# Patient Record
Sex: Female | Born: 1985 | Hispanic: No | Marital: Single | Smoking: Never smoker
Health system: Southern US, Community
[De-identification: ages and names within clinical notes are randomized; demographics above are authoritative.]

---

## 2007-09-15 HISTORY — PX: DILATION AND CURETTAGE OF UTERUS: SHX78

## 2009-09-14 HISTORY — PX: FOOT SURGERY: SHX648

## 2014-12-10 ENCOUNTER — Ambulatory Visit: Payer: Self-pay | Admitting: Physician Assistant

## 2015-01-09 ENCOUNTER — Encounter: Payer: Self-pay | Admitting: *Deleted

## 2015-01-17 ENCOUNTER — Ambulatory Visit
Admission: RE | Admit: 2015-01-17 | Discharge: 2015-01-17 | Disposition: A | Discharge status: Home / Self Care | Payer: 59 | Source: Ambulatory Visit | Attending: Otolaryngology | Admitting: Otolaryngology

## 2015-01-17 ENCOUNTER — Ambulatory Visit: Payer: 59 | Admitting: Student in an Organized Health Care Education/Training Program

## 2015-01-17 ENCOUNTER — Encounter
Admission: RE | Disposition: A | Discharge status: Home / Self Care | Payer: Self-pay | Source: Ambulatory Visit | Attending: Otolaryngology

## 2015-01-17 ENCOUNTER — Encounter: Payer: Self-pay | Admitting: *Deleted

## 2015-01-17 DIAGNOSIS — Z9889 Other specified postprocedural states: Secondary | ICD-10-CM | POA: Insufficient documentation

## 2015-01-17 DIAGNOSIS — Z8261 Family history of arthritis: Secondary | ICD-10-CM | POA: Diagnosis not present

## 2015-01-17 DIAGNOSIS — L720 Epidermal cyst: Secondary | ICD-10-CM | POA: Diagnosis not present

## 2015-01-17 DIAGNOSIS — Z8249 Family history of ischemic heart disease and other diseases of the circulatory system: Secondary | ICD-10-CM | POA: Diagnosis not present

## 2015-01-17 DIAGNOSIS — L723 Sebaceous cyst: Secondary | ICD-10-CM | POA: Diagnosis present

## 2015-01-17 HISTORY — PX: LESION REMOVAL: SHX5196

## 2015-01-17 SURGERY — MINOR EXCISION OF LESION
Anesthesia: General

## 2015-01-17 MED ORDER — GLYCOPYRROLATE 0.2 MG/ML IJ SOLN
INTRAMUSCULAR | Status: DC | PRN
  Administered 2015-01-17: 0.2 mg via INTRAVENOUS

## 2015-01-17 MED ORDER — OXYCODONE HCL 5 MG PO TABS
5.0000 mg | ORAL_TABLET | Freq: Once | ORAL | Status: AC | PRN
  Administered 2015-01-17: 5 mg via ORAL

## 2015-01-17 MED ORDER — PROPOFOL 10 MG/ML IV BOLUS
INTRAVENOUS | Status: DC | PRN
  Administered 2015-01-17: 200 mg via INTRAVENOUS

## 2015-01-17 MED ORDER — OXYCODONE-ACETAMINOPHEN 5-325 MG PO TABS: 1.0000 | ORAL_TABLET | Freq: Once | ORAL | Status: DC | PRN

## 2015-01-17 MED ORDER — LIDOCAINE HCL (PF) 1 % IJ SOLN: INTRAMUSCULAR | Status: DC | PRN

## 2015-01-17 MED ORDER — LIDOCAINE-EPINEPHRINE 1 %-1:100000 IJ SOLN
INTRAMUSCULAR | Status: DC | PRN
  Administered 2015-01-17: 4 mL

## 2015-01-17 MED ORDER — ONDANSETRON HCL 4 MG/2ML IJ SOLN
INTRAMUSCULAR | Status: DC | PRN
  Administered 2015-01-17: 4 mg via INTRAVENOUS

## 2015-01-17 MED ORDER — HYDROCODONE-ACETAMINOPHEN 7.5-325 MG PO TABS: 1.0000 | ORAL_TABLET | Freq: Once | ORAL | Status: DC | PRN

## 2015-01-17 MED ORDER — TRAMADOL HCL 50 MG PO TABS: ORAL_TABLET | ORAL | Status: DC

## 2015-01-17 MED ORDER — MIDAZOLAM HCL 5 MG/5ML IJ SOLN
INTRAMUSCULAR | Status: DC | PRN
  Administered 2015-01-17: 2 mg via INTRAVENOUS

## 2015-01-17 MED ORDER — FENTANYL CITRATE (PF) 100 MCG/2ML IJ SOLN
INTRAMUSCULAR | Status: DC | PRN
  Administered 2015-01-17: 25 ug via INTRAVENOUS
  Administered 2015-01-17: 50 ug via INTRAVENOUS
  Administered 2015-01-17 (×3): 25 ug via INTRAVENOUS

## 2015-01-17 MED ORDER — LIDOCAINE HCL (CARDIAC) 20 MG/ML IV SOLN
INTRAVENOUS | Status: DC | PRN
  Administered 2015-01-17: 50 mg via INTRAVENOUS

## 2015-01-17 MED ORDER — LACTATED RINGERS IV SOLN
INTRAVENOUS | Status: DC
  Administered 2015-01-17: 09:00:00 via INTRAVENOUS

## 2015-01-17 MED ORDER — DEXAMETHASONE SODIUM PHOSPHATE 4 MG/ML IJ SOLN
INTRAMUSCULAR | Status: DC | PRN
  Administered 2015-01-17: 4 mg via INTRAVENOUS

## 2015-01-17 MED ORDER — BACITRACIN 500 UNIT/GM EX OINT
TOPICAL_OINTMENT | CUTANEOUS | Status: DC | PRN
  Administered 2015-01-17: 1 via TOPICAL

## 2015-01-17 SURGICAL SUPPLY — 29 items
BLADE SURG 15 STRL LF DISP TIS (BLADE) IMPLANT
BLADE SURG 15 STRL SS (BLADE)
CORD BIP STRL DISP 12FT (MISCELLANEOUS) IMPLANT
DRAPE HEAD BAR (DRAPES) ×3 IMPLANT
DRESSING TELFA 4X3 1S ST N-ADH (GAUZE/BANDAGES/DRESSINGS) IMPLANT
ELECT CAUTERY BLADE TIP 2.5 (TIP)
ELECT CAUTERY NEEDLE 2.0 MIC (NEEDLE) ×3 IMPLANT
ELECTRODE CAUTERY BLDE TIP 2.5 (TIP) IMPLANT
GAUZE SPONGE 4X4 12PLY STRL (GAUZE/BANDAGES/DRESSINGS) IMPLANT
GLOVE PI ULTRA LF STRL 7.5 (GLOVE) ×2 IMPLANT
GLOVE PI ULTRA NON LATEX 7.5 (GLOVE) ×4
LIQUID BAND (GAUZE/BANDAGES/DRESSINGS) IMPLANT
NEEDLE HYPO 25GX1X1/2 BEV (NEEDLE) ×3 IMPLANT
NS IRRIG 500ML POUR BTL (IV SOLUTION) ×3 IMPLANT
PACK DRAPE NASAL/ENT (PACKS) ×3 IMPLANT
PAD GROUND ADULT SPLIT (MISCELLANEOUS) ×3 IMPLANT
PENCIL ELECTRO HAND CTR (MISCELLANEOUS) ×3 IMPLANT
SOL PREP PVP 2OZ (MISCELLANEOUS) ×3
SOLUTION PREP PVP 2OZ (MISCELLANEOUS) ×1 IMPLANT
SPONGE XRAY 4X4 16PLY STRL (MISCELLANEOUS) ×6 IMPLANT
STRAP BODY AND KNEE 60X3 (MISCELLANEOUS) ×3 IMPLANT
SUCTION FRAZIER TIP 10 FR DISP (SUCTIONS) IMPLANT
SUT PROLENE 5 0 PS 3 (SUTURE) ×6 IMPLANT
SUT VIC AB 4-0 RB1 27 (SUTURE) ×2
SUT VIC AB 4-0 RB1 27X BRD (SUTURE) ×1 IMPLANT
SUT VIC AB 5-0 P-3 18X BRD (SUTURE) ×1 IMPLANT
SUT VIC AB 5-0 P3 18 (SUTURE) ×2
SYRINGE 10CC LL (SYRINGE) ×3 IMPLANT
TOWEL OR 17X26 4PK STRL BLUE (TOWEL DISPOSABLE) ×3 IMPLANT

## 2015-01-17 NOTE — Anesthesia Postprocedure Evaluation (Signed)
  Anesthesia Post-op Note  Patient: Janice BallShavon Alease Laba  Procedure(s) Performed: Procedure(s): MINOR EXICISION OF LESION (N/A)  Anesthesia type:General  Patient location: PACU  Post pain: Pain level controlled  Post assessment: Post-op Vital signs reviewed, Patient's Cardiovascular Status Stable, Respiratory Function Stable, Patent Airway and No signs of Nausea or vomiting  Post vital signs: Reviewed and stable  Last Vitals:  Filed Vitals:   01/17/15 1219  BP: 108/77  Pulse: 83  Temp:   Resp: 18    Level of consciousness: awake, alert  and patient cooperative  Complications: No apparent anesthesia complications

## 2015-01-17 NOTE — Anesthesia Procedure Notes (Signed)
Procedure Name: LMA Insertion Date/Time: 01/17/2015 9:10 AM Performed by: Andee PolesBUSH, Haifa Hatton Pre-anesthesia Checklist: Patient identified, Emergency Drugs available, Suction available, Timeout performed and Patient being monitored Patient Re-evaluated:Patient Re-evaluated prior to inductionOxygen Delivery Method: Circle system utilized Preoxygenation: Pre-oxygenation with 100% oxygen Intubation Type: IV induction LMA: LMA inserted LMA Size: 4.0 Number of attempts: 1 Placement Confirmation: positive ETCO2 and breath sounds checked- equal and bilateral Tube secured with: Tape

## 2015-01-17 NOTE — Discharge Instructions (Signed)

## 2015-01-17 NOTE — Op Note (Signed)
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 Advancement flap closure. Anesth: Gen Complications: None EBL: 100ml  Procedure: The patient was brought to the operating room and placed in a supine position on the operating table. She was given general anesthesia by laryngeal mask anesthesia. She was prepped and draped in a sterile fashion. The forehead was marked across the glabella with a horizontal line and then extended up just underneath the eyebrows on both sides. This created a incision line that was below the glabellar cysts. The area was infiltrated with 1% Xylocaine with epi 1 200,000 for vasoconstriction. 6 mL was used. The incision line was was cut with the scalpel and skin elevated inferiorly. Skin was very scarred down here because of the multiple sebaceous cysts that had been drained previously. Dissection was carried underneath the cysts were was scarred down to the underlying muscle. There is a lot of oozing that was controlled with electro cautery. A superiorly based flap of skin was elevated underneath the cysts until I got into the forehead area. At this point, there were no further cysts here and the skin thinned somewhat. Approximately 2 cm of the inferior border of this flap was removed. This was the area of scarred skin with subcutaneous cysts. There is also cysts on the right side laterally along the medial canthal area. These were dissected and removed making sure the medial canthal ligament was not injured. The right nasal artery was left intact. The forehead was undermined somewhat in the midline to free up the flap could be advanced down to close the defect. The wound was irrigated and bleeding was controlled with electrocautery. A did not feel a drain was necessary, although she could have more  bruising without a drain. I felt the extra scarring from a drain was worse then potential bruising. The wound was closed with 4-0 Vicryl sutures. The dermis was tacked first in the corners of the advancing flap and then across the glabella. The lateral wings underneath the brow were then closed with 4-0 Vicryl. The skin edges were closed with running locking 5-0 Prolene sutures. The wound was covered with bacitracin ointment and no dressing was applied. The patient was awakened and taken to the recovery room in satisfactory condition. There were no operative complications. Total estimated blood loss was 100 mL area  Vernie MurdersPaul Renton Berkley MD 01/17/2015 11:45am

## 2015-01-17 NOTE — Anesthesia Preprocedure Evaluation (Signed)
Anesthesia Evaluation  Patient identified by MRN, date of birth, ID band  Reviewed: Allergy & Precautions, H&P , NPO status , Patient's Chart, lab work & pertinent test results  Airway Mallampati: II  TM Distance: >3 FB Neck ROM: full    Dental no notable dental hx.    Pulmonary    Pulmonary exam normal       Cardiovascular Exercise Tolerance: Good Normal cardiovascular exam    Neuro/Psych    GI/Hepatic   Endo/Other    Renal/GU      Musculoskeletal   Abdominal   Peds  Hematology   Anesthesia Other Findings Bmi=32;  Reproductive/Obstetrics negative OB ROS                             Anesthesia Physical Anesthesia Plan  ASA: II  Anesthesia Plan: General   Post-op Pain Management:    Induction:   Airway Management Planned:   Additional Equipment:   Intra-op Plan:   Post-operative Plan:   Informed Consent: I have reviewed the patients History and Physical, chart, labs and discussed the procedure including the risks, benefits and alternatives for the proposed anesthesia with the patient or authorized representative who has indicated his/her understanding and acceptance.     Plan Discussed with: CRNA  Anesthesia Plan Comments:         Anesthesia Quick Evaluation

## 2015-01-17 NOTE — Transfer of Care (Signed)
Immediate Anesthesia Transfer of Care Note  Patient: Janice AuerbachShavon Alease Oconnor  Procedure(s) Performed: Procedure(s): MINOR EXICISION OF LESION (N/A)  Patient Location: PACU  Anesthesia Type: General  Level of Consciousness: awake, alert  and patient cooperative  Airway and Oxygen Therapy: Patient Spontanous Breathing and Patient connected to supplemental oxygen  Post-op Assessment: Post-op Vital signs reviewed, Patient's Cardiovascular Status Stable, Respiratory Function Stable, Patent Airway and No signs of Nausea or vomiting  Post-op Vital Signs: Reviewed and stable  Complications: No apparent anesthesia complications

## 2015-01-17 NOTE — H&P (Signed)
  H&P has been reviewed and no changes necessary. To be downloaded later. 

## 2015-01-22 ENCOUNTER — Encounter: Payer: Self-pay | Admitting: Otolaryngology

## 2015-01-22 LAB — SURGICAL PATHOLOGY

## 2017-02-11 ENCOUNTER — Other Ambulatory Visit: Payer: Self-pay | Admitting: Obstetrics & Gynecology

## 2017-02-11 DIAGNOSIS — N6320 Unspecified lump in the left breast, unspecified quadrant: Secondary | ICD-10-CM

## 2017-02-12 ENCOUNTER — Other Ambulatory Visit: Payer: Self-pay | Admitting: Obstetrics & Gynecology

## 2017-02-12 DIAGNOSIS — N6320 Unspecified lump in the left breast, unspecified quadrant: Secondary | ICD-10-CM

## 2017-02-12 DIAGNOSIS — Z1239 Encounter for other screening for malignant neoplasm of breast: Secondary | ICD-10-CM

## 2017-02-19 ENCOUNTER — Ambulatory Visit
Admission: RE | Admit: 2017-02-19 | Discharge: 2017-02-19 | Disposition: A | Discharge status: Home / Self Care | Payer: 59 | Source: Ambulatory Visit | Attending: Obstetrics & Gynecology | Admitting: Obstetrics & Gynecology

## 2017-02-19 ENCOUNTER — Encounter: Payer: Self-pay | Admitting: Radiology

## 2017-02-19 DIAGNOSIS — N6324 Unspecified lump in the left breast, lower inner quadrant: Secondary | ICD-10-CM | POA: Insufficient documentation

## 2017-02-19 DIAGNOSIS — N6320 Unspecified lump in the left breast, unspecified quadrant: Secondary | ICD-10-CM

## 2017-02-19 DIAGNOSIS — Z1239 Encounter for other screening for malignant neoplasm of breast: Secondary | ICD-10-CM

## 2017-11-24 ENCOUNTER — Other Ambulatory Visit: Payer: Self-pay

## 2017-11-24 ENCOUNTER — Ambulatory Visit (INDEPENDENT_AMBULATORY_CARE_PROVIDER_SITE_OTHER): Payer: BLUE CROSS/BLUE SHIELD

## 2017-11-24 ENCOUNTER — Ambulatory Visit
Admission: EM | Admit: 2017-11-24 | Discharge: 2017-11-24 | Disposition: A | Discharge status: Home / Self Care | Payer: BLUE CROSS/BLUE SHIELD | Attending: Family Medicine | Admitting: Family Medicine

## 2017-11-24 DIAGNOSIS — S93509A Unspecified sprain of unspecified toe(s), initial encounter: Secondary | ICD-10-CM

## 2017-11-24 DIAGNOSIS — S93501A Unspecified sprain of right great toe, initial encounter: Secondary | ICD-10-CM | POA: Diagnosis not present

## 2017-11-24 DIAGNOSIS — M79674 Pain in right toe(s): Secondary | ICD-10-CM

## 2017-11-24 NOTE — Discharge Instructions (Signed)
Rest, ice, elevation, otc analgesics/NSAIDS prn

## 2017-11-24 NOTE — ED Provider Notes (Signed)
[facility]    CSN: [id number] Arrival date & time: 11/24/17  1007     History   Chief Complaint Chief Complaint  Patient presents with  . Foot Pain    APPT    HPI Janice Oconnor is a 32 y.o. female.   32 yo female with a c/o right big toe pain for 3-4 days. Denies any specific injuries, but states she does work out a lot (running and jumping exercises). Denies any redness, swelling, fevers, chills.     Foot Pain  This is a new problem.    History reviewed. No pertinent past medical history.  There are no active problems to display for this patient.   Past Surgical History:  Procedure Laterality Date  . DILATION AND CURETTAGE OF UTERUS  2009  . FOOT SURGERY  2011  . LESION REMOVAL N/A 01/17/2015   Procedure: MINOR EXICISION OF LESION;  Surgeon: Vernie Murders, MD;  Location: [facility];  Service: ENT;  Laterality: N/A;    OB History    No data available       Home Medications    Prior to Admission medications   Medication Sig Start Date End Date Taking? Authorizing Provider  norethindrone-ethinyl estradiol-iron (ESTROSTEP FE,TILIA FE,TRI-LEGEST FE) 1-20/1-30/1-35 MG-MCG tablet Take 1 tablet by mouth daily.   Yes [provider]  Probiotic Product (PROBIOTIC DAILY PO) Take by mouth.    [provider]  traMADol (ULTRAM) 50 MG tablet 1 or 2 pills every 4-6 hours as needed. 01/17/15   Vernie Murders, MD    Family History History reviewed. No pertinent family history.  Social History Social History   Tobacco Use  . Smoking status: Never Smoker  . Smokeless tobacco: Never Used  Substance Use Topics  . Alcohol use: Yes    Alcohol/week: 0.0 oz    Comment: Rare- 1x/4 months  . Drug use: No     Allergies   Patient has no known allergies.   Review of Systems Review of Systems   Physical Exam Triage Vital Signs ED Triage Vitals  Enc Vitals Group     BP 11/24/17 1020 117/62     Pulse Rate 11/24/17 1020  71     Resp 11/24/17 1020 16     Temp 11/24/17 1020 98.4 F (36.9 C)     Temp Source 11/24/17 1020 Oral     SpO2 11/24/17 1020 100 %     Weight 11/24/17 1021 177 lb (80.3 kg)     Height 11/24/17 1021 5\' 5"  (1.651 m)     Head Circumference --      Peak Flow --      Pain Score 11/24/17 1021 7     Pain Loc --      Pain Edu? --      Excl. in GC? --    No data found.  Updated Vital Signs BP 117/62 (BP Location: Left Arm)   Pulse 71   Temp 98.4 F (36.9 C) (Oral)   Resp 16   Ht 5\' 5"  (1.651 m)   Wt 177 lb (80.3 kg)   SpO2 100%   BMI 29.45 kg/m   Visual Acuity Right Eye Distance:   Left Eye Distance:   Bilateral Distance:    Right Eye Near:   Left Eye Near:    Bilateral Near:     Physical Exam  Constitutional: She appears well-developed and well-nourished. No distress.  Musculoskeletal:       Right foot: There  is tenderness (mild; MTP joint area) and bony tenderness. There is normal range of motion, no swelling, normal capillary refill, no crepitus, no deformity and no laceration.  Skin: She is not diaphoretic.  Nursing note and vitals reviewed.    UC Treatments / Results  Labs (all labs ordered are listed, but only abnormal results are displayed) Labs Reviewed - No data to display  EKG  EKG Interpretation None       Radiology Dg Toe Great Right  Result Date: 11/24/2017 CLINICAL DATA:  Right great toe pain for the past 2-3 weeks. No known injury. EXAM: RIGHT GREAT TOE COMPARISON:  None. FINDINGS: No acute fracture or dislocation. Postsurgical changes related to prior first metatarsal head osteotomy. Joint spaces are preserved. Bone mineralization is normal. Soft tissues are unremarkable. IMPRESSION: Prior first metatarsal head osteotomy. No acute osseous abnormality or significant degenerative changes. Electronically Signed   By: Obie DredgeWilliam T Derry M.D.   On: 11/24/2017 11:07    Procedures Procedures (including critical care time)  Medications Ordered in  UC Medications - No data to display   Initial Impression / Assessment and Plan / UC Course  I have reviewed the triage vital signs and the nursing notes.  Pertinent labs & imaging results that were available during my care of the patient were reviewed by me and considered in my medical decision making (see chart for details).       Final Clinical Impressions(s) / UC Diagnoses   Final diagnoses:  Sprain of toe, initial encounter    ED Discharge Orders    None     1. x-ray result (negative for acute injury) and diagnosis reviewed with patient  2. Recommend supportive treatment with rest, ice, elevation, otc analgesics prn  3. Follow-up prn if symptoms worsen or don't improve Controlled Substance Prescriptions [place] Controlled Substance Registry consulted? Not Applicable   Payton Mccallumonty, Ozzie Remmers, MD 11/24/17 1155

## 2017-11-24 NOTE — ED Triage Notes (Signed)
Pt reports right foot pain near base of great of toe on dorsum of foot. Pain when walking 7/10. Denies recent injury but does work out a lot

## 2018-11-21 ENCOUNTER — Ambulatory Visit: Payer: BLUE CROSS/BLUE SHIELD | Admitting: Anesthesiology

## 2018-11-21 ENCOUNTER — Ambulatory Visit
Admission: RE | Admit: 2018-11-21 | Discharge: 2018-11-21 | Disposition: A | Discharge status: Home / Self Care | Payer: BLUE CROSS/BLUE SHIELD | Attending: Obstetrics & Gynecology | Admitting: Obstetrics & Gynecology

## 2018-11-21 ENCOUNTER — Encounter: Payer: Self-pay | Admitting: *Deleted

## 2018-11-21 ENCOUNTER — Encounter
Admission: RE | Disposition: A | Discharge status: Home / Self Care | Payer: Self-pay | Source: Home / Self Care | Attending: Obstetrics & Gynecology

## 2018-11-21 ENCOUNTER — Other Ambulatory Visit: Payer: Self-pay

## 2018-11-21 DIAGNOSIS — O034 Incomplete spontaneous abortion without complication: Secondary | ICD-10-CM | POA: Diagnosis present

## 2018-11-21 HISTORY — PX: DILATION AND CURETTAGE OF UTERUS: SHX78

## 2018-11-21 LAB — CBC
HEMATOCRIT: 37.9 % (ref 36.0–46.0)
Hemoglobin: 12.3 g/dL (ref 12.0–15.0)
MCH: 28.9 pg (ref 26.0–34.0)
MCHC: 32.5 g/dL (ref 30.0–36.0)
MCV: 89 fL (ref 80.0–100.0)
NRBC: 0 % (ref 0.0–0.2)
Platelets: 263 10*3/uL (ref 150–400)
RBC: 4.26 MIL/uL (ref 3.87–5.11)
RDW: 13 % (ref 11.5–15.5)
WBC: 6.2 10*3/uL (ref 4.0–10.5)

## 2018-11-21 LAB — BASIC METABOLIC PANEL
ANION GAP: 9 (ref 5–15)
BUN: 13 mg/dL (ref 6–20)
CHLORIDE: 105 mmol/L (ref 98–111)
CO2: 24 mmol/L (ref 22–32)
Calcium: 8.6 mg/dL — ABNORMAL LOW (ref 8.9–10.3)
Creatinine, Ser: 0.75 mg/dL (ref 0.44–1.00)
GFR calc Af Amer: >60 mL/min (ref >60)
GFR calc non Af Amer: >60 mL/min (ref >60)
GLUCOSE: 92 mg/dL (ref 70–99)
Potassium: 3.9 mmol/L (ref 3.5–5.1)
Sodium: 138 mmol/L (ref 135–145)

## 2018-11-21 LAB — TYPE AND SCREEN
ABO/RH(D): A POS
ANTIBODY SCREEN: NEGATIVE

## 2018-11-21 LAB — ABO/RH: ABO/RH(D): A POS

## 2018-11-21 SURGERY — DILATION AND CURETTAGE
Anesthesia: General

## 2018-11-21 MED ORDER — FENTANYL CITRATE (PF) 100 MCG/2ML IJ SOLN
INTRAMUSCULAR | Status: DC | PRN
  Administered 2018-11-21 (×4): 25 ug via INTRAVENOUS

## 2018-11-21 MED ORDER — DEXAMETHASONE SODIUM PHOSPHATE 10 MG/ML IJ SOLN
INTRAMUSCULAR | Status: AC
  Filled 2018-11-21: qty 1

## 2018-11-21 MED ORDER — PROPOFOL 10 MG/ML IV BOLUS
INTRAVENOUS | Status: DC | PRN
  Administered 2018-11-21: 50 mg via INTRAVENOUS
  Administered 2018-11-21: 150 mg via INTRAVENOUS

## 2018-11-21 MED ORDER — FAMOTIDINE 20 MG PO TABS
20.0000 mg | ORAL_TABLET | Freq: Once | ORAL | Status: AC
  Administered 2018-11-21: 20 mg via ORAL

## 2018-11-21 MED ORDER — ONDANSETRON HCL 4 MG/2ML IJ SOLN
INTRAMUSCULAR | Status: DC | PRN
  Administered 2018-11-21: 4 mg via INTRAVENOUS

## 2018-11-21 MED ORDER — SODIUM CHLORIDE 0.9 % IV SOLN
100.0000 mg | Freq: Two times a day (BID) | INTRAVENOUS | Status: DC
  Administered 2018-11-21: 100 mg via INTRAVENOUS
  Filled 2018-11-21 (×4): qty 100

## 2018-11-21 MED ORDER — LIDOCAINE HCL (CARDIAC) PF 100 MG/5ML IV SOSY
PREFILLED_SYRINGE | INTRAVENOUS | Status: DC | PRN
  Administered 2018-11-21: 60 mg via INTRAVENOUS

## 2018-11-21 MED ORDER — METHYLERGONOVINE MALEATE 0.2 MG/ML IJ SOLN
INTRAMUSCULAR | Status: AC
  Filled 2018-11-21: qty 1

## 2018-11-21 MED ORDER — ACETAMINOPHEN 500 MG PO TABS
1000.0000 mg | ORAL_TABLET | Freq: Once | ORAL | Status: AC
  Administered 2018-11-21: 1000 mg via ORAL

## 2018-11-21 MED ORDER — LACTATED RINGERS IV SOLN
INTRAVENOUS | Status: DC
  Administered 2018-11-21: 12:00:00 via INTRAVENOUS

## 2018-11-21 MED ORDER — ACETAMINOPHEN 325 MG PO TABS: 650.0000 mg | ORAL_TABLET | ORAL | Status: DC | PRN

## 2018-11-21 MED ORDER — ACETAMINOPHEN 500 MG PO TABS
ORAL_TABLET | ORAL | Status: AC
  Filled 2018-11-21: qty 2

## 2018-11-21 MED ORDER — FENTANYL CITRATE (PF) 100 MCG/2ML IJ SOLN: 25.0000 ug | INTRAMUSCULAR | Status: DC | PRN

## 2018-11-21 MED ORDER — MORPHINE SULFATE (PF) 4 MG/ML IV SOLN: 1.0000 mg | INTRAVENOUS | Status: DC | PRN

## 2018-11-21 MED ORDER — KETOROLAC TROMETHAMINE 30 MG/ML IJ SOLN
INTRAMUSCULAR | Status: AC
  Filled 2018-11-21: qty 1

## 2018-11-21 MED ORDER — LACTATED RINGERS IV SOLN: INTRAVENOUS | Status: DC

## 2018-11-21 MED ORDER — METHYLERGONOVINE MALEATE 0.2 MG/ML IJ SOLN
INTRAMUSCULAR | Status: DC | PRN
  Administered 2018-11-21: 0.2 mg via INTRAMUSCULAR

## 2018-11-21 MED ORDER — FAMOTIDINE 20 MG PO TABS
ORAL_TABLET | ORAL | Status: AC
  Filled 2018-11-21: qty 1

## 2018-11-21 MED ORDER — FENTANYL CITRATE (PF) 100 MCG/2ML IJ SOLN
INTRAMUSCULAR | Status: AC
  Filled 2018-11-21: qty 2

## 2018-11-21 MED ORDER — KETOROLAC TROMETHAMINE 30 MG/ML IJ SOLN
INTRAMUSCULAR | Status: DC | PRN
  Administered 2018-11-21: 30 mg via INTRAVENOUS

## 2018-11-21 MED ORDER — ONDANSETRON HCL 4 MG/2ML IJ SOLN: 4.0000 mg | Freq: Once | INTRAMUSCULAR | Status: DC | PRN

## 2018-11-21 MED ORDER — PROPOFOL 10 MG/ML IV BOLUS
INTRAVENOUS | Status: AC
  Filled 2018-11-21: qty 20

## 2018-11-21 MED ORDER — MIDAZOLAM HCL 5 MG/5ML IJ SOLN: INTRAMUSCULAR | Status: DC | PRN

## 2018-11-21 MED ORDER — DEXAMETHASONE SODIUM PHOSPHATE 10 MG/ML IJ SOLN
INTRAMUSCULAR | Status: DC | PRN
  Administered 2018-11-21: 6 mg via INTRAVENOUS

## 2018-11-21 MED ORDER — ACETAMINOPHEN 650 MG RE SUPP
650.0000 mg | RECTAL | Status: DC | PRN
  Filled 2018-11-21: qty 1

## 2018-11-21 MED ORDER — MIDAZOLAM HCL 5 MG/5ML IJ SOLN
INTRAMUSCULAR | Status: DC | PRN
  Administered 2018-11-21: 2 mg via INTRAVENOUS

## 2018-11-21 MED ORDER — MIDAZOLAM HCL 2 MG/2ML IJ SOLN
INTRAMUSCULAR | Status: AC
  Filled 2018-11-21: qty 2

## 2018-11-21 MED ORDER — KETOROLAC TROMETHAMINE 30 MG/ML IJ SOLN
30.0000 mg | Freq: Four times a day (QID) | INTRAMUSCULAR | Status: DC
  Filled 2018-11-21: qty 1

## 2018-11-21 MED ORDER — SILVER NITRATE-POT NITRATE 75-25 % EX MISC
CUTANEOUS | Status: DC | PRN
  Administered 2018-11-21: 3

## 2018-11-21 MED ORDER — ONDANSETRON HCL 4 MG/2ML IJ SOLN
INTRAMUSCULAR | Status: AC
  Filled 2018-11-21: qty 2

## 2018-11-21 MED ORDER — LIDOCAINE HCL (PF) 2 % IJ SOLN
INTRAMUSCULAR | Status: AC
  Filled 2018-11-21: qty 10

## 2018-11-21 SURGICAL SUPPLY — 16 items
CATH ROBINSON RED A/P 16FR (CATHETERS) ×2 IMPLANT
CLOTH 2% CHLOROHEXIDINE 3PK (PERSONAL CARE ITEMS) ×2 IMPLANT
COVER WAND RF STERILE (DRAPES) ×2 IMPLANT
DRSG TELFA 4X3 1S NADH ST (GAUZE/BANDAGES/DRESSINGS) ×2 IMPLANT
GLOVE PI ORTHOPRO 6.5 (GLOVE) ×1
GLOVE PI ORTHOPRO STRL 6.5 (GLOVE) ×1 IMPLANT
GLOVE SURG SYN 6.5 ES PF (GLOVE) ×2 IMPLANT
GOWN STRL REUS W/ TWL LRG LVL3 (GOWN DISPOSABLE) ×2 IMPLANT
GOWN STRL REUS W/TWL LRG LVL3 (GOWN DISPOSABLE) ×2
KIT TURNOVER CYSTO (KITS) ×2 IMPLANT
NEEDLE SPNL 20GX3.5 QUINCKE YW (NEEDLE) ×2 IMPLANT
PACK DNC HYST (MISCELLANEOUS) ×2 IMPLANT
PAD OB MATERNITY 4.3X12.25 (PERSONAL CARE ITEMS) ×2 IMPLANT
PAD PREP 24X41 OB/GYN DISP (PERSONAL CARE ITEMS) ×2 IMPLANT
SYR 10ML LL (SYRINGE) ×2 IMPLANT
TOWEL OR 17X26 4PK STRL BLUE (TOWEL DISPOSABLE) ×2 IMPLANT

## 2018-11-21 NOTE — Anesthesia Postprocedure Evaluation (Signed)
Anesthesia Post Note  Patient: Regulatory affairs officer  Procedure(s) Performed: DILATATION AND CURETTAGE FOR RETAINED PRODUCTS OF CONCEPTION. (N/A )  Patient location during evaluation: PACU Anesthesia Type: General Level of consciousness: awake and alert Pain management: pain level controlled Vital Signs Assessment: post-procedure vital signs reviewed and stable Respiratory status: spontaneous breathing and respiratory function stable Cardiovascular status: stable Anesthetic complications: no     Last Vitals:  Vitals:   11/21/18 1713 11/21/18 1742  BP: (!) 118/97 124/82  Pulse: 71 73  Resp: 15 16  Temp: (!) 36.3 C (!) 36.4 C  SpO2: 99% 100%    Last Pain:  Vitals:   11/21/18 1742  TempSrc: Tympanic  PainSc: 3                  KEPHART,WILLIAM K

## 2018-11-21 NOTE — H&P (Signed)
Preoperative History and Physical  Janice Oconnor is a 33 y.o. here for surgical management of persisted elevated beta HCG following miscarriage.  She has no bleeding or pain.  No significant preoperative concerns.  Ultrasound today showed thickened heterogenous endometrium.  Proposed surgery: dilation and curettage  History reviewed. No pertinent past medical history. Past Surgical History:  Procedure Laterality Date  . DILATION AND CURETTAGE OF UTERUS  2009  . FOOT SURGERY  2011  . LESION REMOVAL N/A 01/17/2015   Procedure: MINOR EXICISION OF LESION;  Surgeon: Vernie Murders, MD;  Location: [facility];  Service: ENT;  Laterality: N/A;   OB History  Gravida Para Term Preterm AB Living  1            SAB TAB Ectopic Multiple Live Births               # Outcome Date GA Lbr Len/2nd Weight Sex Delivery Anes PTL Lv  1 Current           Patient denies any other pertinent gynecologic issues.   No current facility-administered medications on file prior to encounter.    Current Outpatient Medications on File Prior to Encounter  Medication Sig Dispense Refill  . norethindrone-ethinyl estradiol-iron (ESTROSTEP FE,TILIA FE,TRI-LEGEST FE) 1-20/1-30/1-35 MG-MCG tablet Take 1 tablet by mouth daily.    . traMADol (ULTRAM) 50 MG tablet 1 or 2 pills every 4-6 hours as needed. (Patient not taking: Reported on 11/18/2018) 30 tablet 0   No Known Allergies  Social History:   reports that she has never smoked. She has never used smokeless tobacco. She reports current alcohol use. She reports that she does not use drugs.  History reviewed. No pertinent family history.  Review of Systems: Noncontributory  PHYSICAL EXAM: Blood pressure 121/84, pulse 67, temperature 98.6 F (37 C), temperature source Oral, resp. rate 18, height 5\' 4"  (1.626 m), weight 83.5 kg, SpO2 100 %. General appearance - alert, well appearing, and in no distress Chest - clear to auscultation, no wheezes, rales or  rhonchi, symmetric air entry Heart - normal rate and regular rhythm Abdomen - soft, nontender, nondistended, no masses or organomegaly Pelvic - examination not indicated Extremities - peripheral pulses normal, no pedal edema, no clubbing or cyanosis  Labs: Results for orders placed or performed during the hospital encounter of 11/21/18 (from the past 336 hour(s))  CBC   Collection Time: 11/21/18 11:39 AM  Result Value Ref Range   WBC 6.2 4.0 - 10.5 K/uL   RBC 4.26 3.87 - 5.11 MIL/uL   Hemoglobin 12.3 12.0 - 15.0 g/dL   HCT 35.4 56.2 - 56.3 %   MCV 89.0 80.0 - 100.0 fL   MCH 28.9 26.0 - 34.0 pg   MCHC 32.5 30.0 - 36.0 g/dL   RDW 89.3 73.4 - 28.7 %   Platelets 263 150 - 400 K/uL   nRBC 0.0 0.0 - 0.2 %  Basic metabolic panel   Collection Time: 11/21/18 11:39 AM  Result Value Ref Range   Sodium 138 135 - 145 mmol/L   Potassium 3.9 3.5 - 5.1 mmol/L   Chloride 105 98 - 111 mmol/L   CO2 24 22 - 32 mmol/L   Glucose, Bld 92 70 - 99 mg/dL   BUN 13 6 - 20 mg/dL   Creatinine, Ser 6.81 0.44 - 1.00 mg/dL   Calcium 8.6 (L) 8.9 - 10.3 mg/dL   GFR calc non Af Amer >60 >60 mL/min   GFR calc Af Amer >  60 >60 mL/min   Anion gap 9 5 - 15  Type and screen [facility]   Collection Time: 11/21/18 11:39 AM  Result Value Ref Range   ABO/RH(D) PENDING    Antibody Screen NEG    Sample Expiration      11/24/2018 Performed at [facility], [address], [place], [place] [address]   ABO/Rh   Collection Time: 11/21/18 11:47 AM  Result Value Ref Range   ABO/RH(D)      A POS Performed at [facility], [address] [place], [place] [address]      Assessment: Retained products of conception  Plan: Patient will undergo surgical management with dilation and curettage.   The risks of surgery were discussed in detail with the patient including but not limited to: bleeding which may require transfusion or reoperation; infection which may require  antibiotics; injury to surrounding organs which may involve bowel, bladder, ureters ; need for additional procedures including laparoscopy or laparotomy; thromboembolic phenomenon, surgical site problems and other postoperative/anesthesia complications. Likelihood of success in alleviating the patient's condition was discussed. Routine postoperative instructions will be reviewed with the patient and her family in detail after surgery.  The patient concurred with the proposed plan, giving informed written consent for the surgery.  Patient has been NPO since last night she will remain NPO for procedure.  Anesthesia and OR aware.  Preoperative prophylactic antibiotics and SCDs ordered on call to the OR.  To OR when ready.  ----- Ranae Plumber, MD Attending Obstetrician and Gynecologist [facility], Department of OB/[facility]N [facility]

## 2018-11-21 NOTE — Anesthesia Procedure Notes (Signed)
Procedure Name: LMA Insertion Date/Time: 11/21/2018 3:54 PM Performed by: Lily Kocher, CRNA Pre-anesthesia Checklist: Patient identified, Patient being monitored, Timeout performed, Emergency Drugs available and Suction available Patient Re-evaluated:Patient Re-evaluated prior to induction Oxygen Delivery Method: Circle system utilized Preoxygenation: Pre-oxygenation with 100% oxygen Induction Type: IV induction Ventilation: Mask ventilation without difficulty LMA: LMA inserted LMA Size: 4.0 Tube type: Oral Number of attempts: 1 Placement Confirmation: positive ETCO2 and breath sounds checked- equal and bilateral Tube secured with: Tape Dental Injury: Teeth and Oropharynx as per pre-operative assessment

## 2018-11-21 NOTE — Anesthesia Preprocedure Evaluation (Signed)
Anesthesia Evaluation  Patient identified by MRN, date of birth, ID band Patient awake    Reviewed: Allergy & Precautions, NPO status , Patient's Chart, lab work & pertinent test results, reviewed documented beta blocker date and time   Airway Mallampati: III  TM Distance: >3 FB     Dental  (+) Chipped   Pulmonary           Cardiovascular      Neuro/Psych    GI/Hepatic   Endo/Other    Renal/GU      Musculoskeletal   Abdominal   Peds  Hematology   Anesthesia Other Findings   Reproductive/Obstetrics                             Anesthesia Physical Anesthesia Plan  ASA: II  Anesthesia Plan: General   Post-op Pain Management:    Induction: Intravenous  PONV Risk Score and Plan:   Airway Management Planned: LMA  Additional Equipment:   Intra-op Plan:   Post-operative Plan:   Informed Consent: I have reviewed the patients History and Physical, chart, labs and discussed the procedure including the risks, benefits and alternatives for the proposed anesthesia with the patient or authorized representative who has indicated his/her understanding and acceptance.       Plan Discussed with: CRNA  Anesthesia Plan Comments:         Anesthesia Quick Evaluation  

## 2018-11-21 NOTE — Anesthesia Post-op Follow-up Note (Signed)
Anesthesia QCDR form completed.        

## 2018-11-21 NOTE — Discharge Instructions (Addendum)
Dilation and Curettage or Vacuum Curettage, Care After °These instructions give you information about caring for yourself after your procedure. Your doctor may also give you more specific instructions. Call your doctor if you have any problems or questions after your procedure. °Follow these instructions at home: °Activity °· Do not drive or use heavy machinery while taking prescription pain medicine. °· For 24 hours after your procedure, avoid driving. °· Take short walks often, followed by rest periods. Ask your doctor what activities are safe for you. After one or two days, you may be able to return to your normal activities. °· Do not lift anything that is heavier than 10 lb (4.5 kg) until your doctor approves. °· For at least 2 weeks, or as long as told by your doctor: °? Do not douche. °? Do not use tampons. °? Do not have sex. °General instructions ° °· Take over-the-counter and prescription medicines only as told by your doctor. This is very important if you take blood thinning medicine. °· Do not take baths, swim, or use a hot tub until your doctor approves. Take showers instead of baths. °· Wear compression stockings as told by your doctor. °· It is up to you to get the results of your procedure. Ask your doctor when your results will be ready. °· Keep all follow-up visits as told by your doctor. This is important. °Contact a doctor if: °· You have very bad cramps that get worse or do not get better with medicine. °· You have very bad pain in your belly (abdomen). °· You cannot drink fluids without throwing up (vomiting). °· You get pain in a different part of the area between your belly and thighs (pelvis). °· You have bad-smelling discharge from your vagina. °· You have a rash. °Get help right away if: °· You are bleeding a lot from your vagina. A lot of bleeding means soaking more than one sanitary pad in an hour, for 2 hours in a row. °· You have clumps of blood (blood clots) coming from your  vagina. °· You have a fever or chills. °· Your belly feels very tender or hard. °· You have chest pain. °· You have trouble breathing. °· You cough up blood. °· You feel dizzy. °· You feel light-headed. °· You pass out (faint). °· You have pain in your neck or shoulder area. °Summary °· Take short walks often, followed by rest periods. Ask your doctor what activities are safe for you. After one or two days, you may be able to return to your normal activities. °· Do not lift anything that is heavier than 10 lb (4.5 kg) until your doctor approves. °· Do not take baths, swim, or use a hot tub until your doctor approves. Take showers instead of baths. °· Contact your doctor if you have any symptoms of infection, like bad-smelling discharge from your vagina. °This information is not intended to replace advice given to you by your health care provider. Make sure you discuss any questions you have with your health care provider. °Document Released: 06/09/2008 Document Revised: 05/18/2016 Document Reviewed: 05/18/2016 °Elsevier Interactive Patient Education © 2019 [employer] ° °AMBULATORY SURGERY  °DISCHARGE INSTRUCTIONS ° ° °1) The drugs that you were given will stay in your system until tomorrow so for the next 24 hours you should not: ° °A) Drive an automobile °B) Make any legal decisions °C) Drink any alcoholic beverage ° ° °2) You may resume regular meals tomorrow.  Today it is better   to start with liquids and gradually work up to solid foods. ° °You may eat anything you prefer, but it is better to start with liquids, then soup and crackers, and gradually work up to solid foods. ° ° °3) Please notify your doctor immediately if you have any unusual bleeding, trouble breathing, redness and pain at the surgery site, drainage, fever, or pain not relieved by medication. ° ° ° °4) Additional Instructions: ° ° ° ° ° ° ° °Please contact your physician with any problems or Same Day Surgery at [phone], Monday through  Friday 6 am to 4 pm, or [facility] Main number at [phone]. ° °

## 2018-11-21 NOTE — Transfer of Care (Signed)
Immediate Anesthesia Transfer of Care Note  Patient: Janice Oconnor  Procedure(s) Performed: DILATATION AND CURETTAGE FOR RETAINED PRODUCTS OF CONCEPTION. (N/A )  Patient Location: PACU  Anesthesia Type:General  Level of Consciousness: sedated  Airway & Oxygen Therapy: Patient Spontanous Breathing and Patient connected to face mask oxygen  Post-op Assessment: Report given to RN and Post -op Vital signs reviewed and stable  Post vital signs: Reviewed and stable  Last Vitals:  Vitals Value Taken Time  BP 107/72   Temp 97.69F   Pulse 63 11/21/2018  4:44 PM  Resp 11 11/21/2018  4:44 PM  SpO2 100 % 11/21/2018  4:44 PM  Vitals shown include unvalidated device data.  Last Pain:  Vitals:   11/21/18 1137  TempSrc: Oral  PainSc: 0-No pain         Complications: No apparent anesthesia complications

## 2018-11-22 ENCOUNTER — Encounter: Payer: Self-pay | Admitting: Obstetrics & Gynecology

## 2018-11-22 NOTE — Op Note (Signed)
Operative Report Dilation and Curettage 11/21/2018  Patient:  Janice Oconnor  33 y.o. female Preoperative diagnosis:  retained products of conception Postoperative diagnosis:  retained products of conception  PROCEDURE:  Procedure(s): DILATATION AND CURETTAGE FOR RETAINED PRODUCTS OF CONCEPTION. (N/A) Surgeon:  Surgeon(s) and Role:    * Elo Marmolejos, Elenora Fender, MD - Primary Anesthesia:  LMA I/O: see flowsheet.  EBL 50cc, UOP 0 cc, IVF 500cc Specimens:  Endometrial curettings/products of conception Complications: None Apparent Disposition:  VS stable to PACU  Findings: Uterus, mobile, normal size, sounding to 9 cm; normal cervix, vagina, perineum. Moderate tissue  Indication for procedure/Consents: 33 y.o. G1P0  here for scheduled surgery for the aforementioned diagnoses.  Risks of surgery were discussed with the patient including but not limited to: bleeding which may require transfusion; infection which may require antibiotics; injury to uterus or surrounding organs; intrauterine scarring which may impair future fertility; need for additional procedures including laparotomy or laparoscopy; and other postoperative/anesthesia complications. Written informed consent was obtained.    Procedure Details:   The patient was then taken to the operating room where anesthesia was administered.  After a formal timeout was performed, she was placed in the dorsal lithotomy position and examined with the above findings. She was then prepped and draped in the sterile manner.  A speculum was then placed in the patient's vagina and a single tooth tenaculum was applied to the anterior lip of the cervix.  The uterus was sounded to 9cm. Her cervix was serially dilated. With findings as above. A sharp curettage was then performed until there was a gritty texture in all four quadrants. The specimen was handed off to nursing.  The tenaculum was removed from the anterior lip of the cervix and the vaginal speculum  was removed after noting good hemostasis with silver nitrate application. The patient tolerated the procedure well and was taken to the recovery area awake, extubated and in stable condition.  The patient will be discharged to home as per PACU criteria.  Routine postoperative instructions given. She will follow up in the clinic in two to four weeks for postoperative evaluation.  Ranae Plumber, MD [facility] Attending Gynecologist

## 2018-11-23 LAB — SURGICAL PATHOLOGY

## 2018-11-25 ENCOUNTER — Other Ambulatory Visit
Admission: RE | Admit: 2018-11-25 | Discharge: 2018-11-25 | Disposition: A | Discharge status: Home / Self Care | Payer: BLUE CROSS/BLUE SHIELD | Source: Ambulatory Visit | Attending: Obstetrics & Gynecology | Admitting: Obstetrics & Gynecology

## 2018-11-25 DIAGNOSIS — O021 Missed abortion: Secondary | ICD-10-CM | POA: Insufficient documentation

## 2018-11-25 LAB — HCG, QUANTITATIVE, PREGNANCY: hCG, Beta Chain, Quant, S: 3 m[IU]/mL (ref <5)

## 2019-04-30 IMAGING — CR DG TOE GREAT 2+V*R*
3 series · 4 of 4 positions shown · non-contrast
Comparison: None.

CLINICAL DATA: Right great toe pain for the past 2-3 weeks. No
known injury.

EXAM:
RIGHT GREAT TOE

[toe ap]
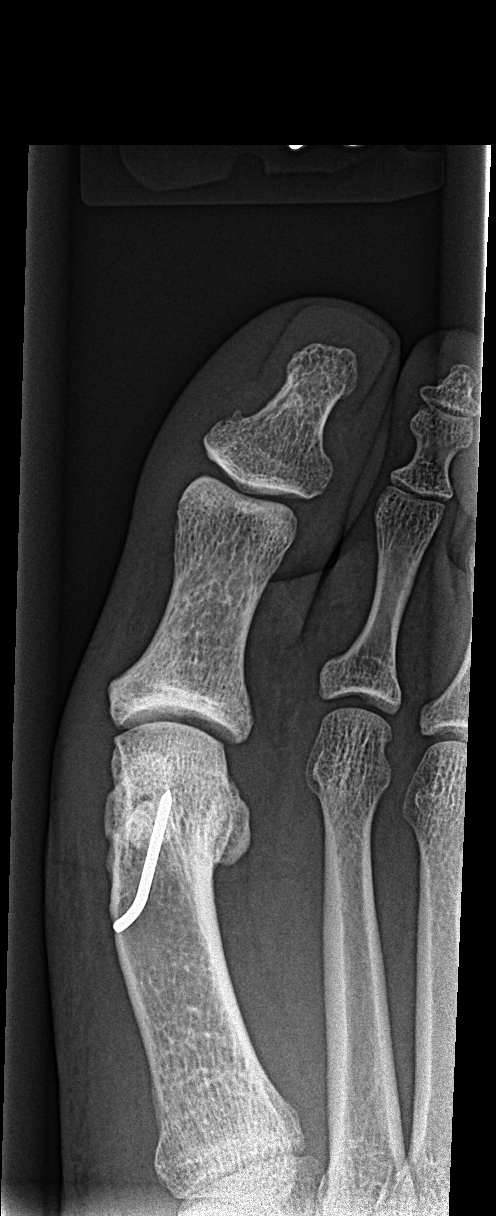

[toe obl]
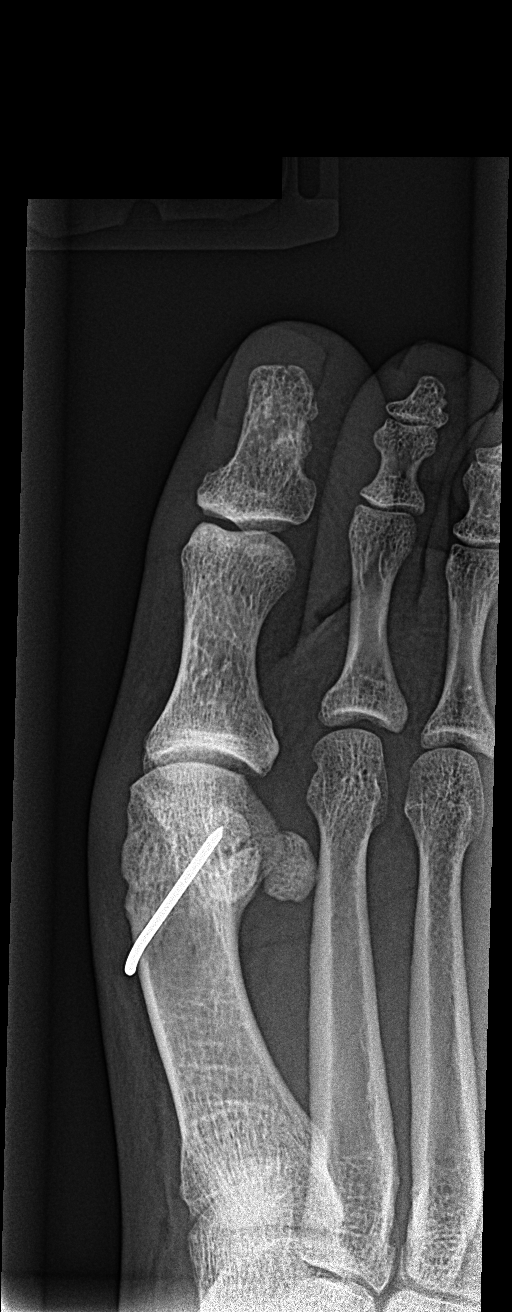

[Series 3: toe lat · 0.14mm/px · 2 of 2 slices shown]
[im 1/2]
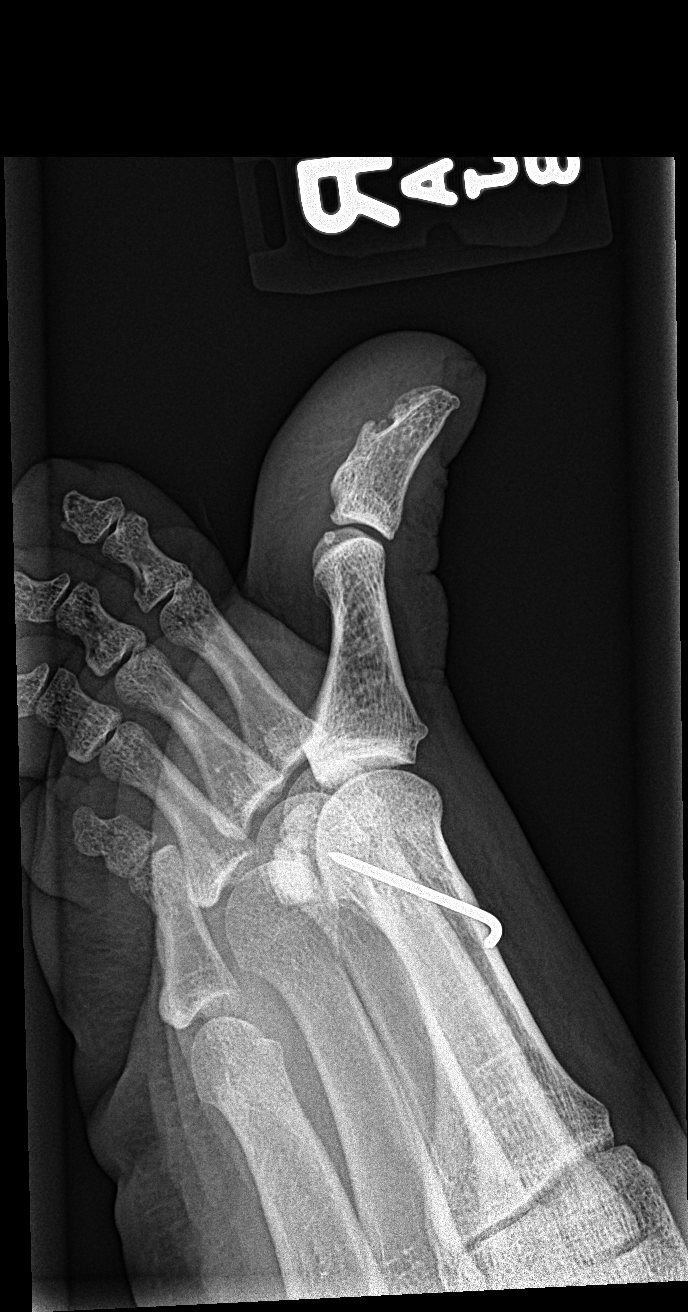
[im 2/2]
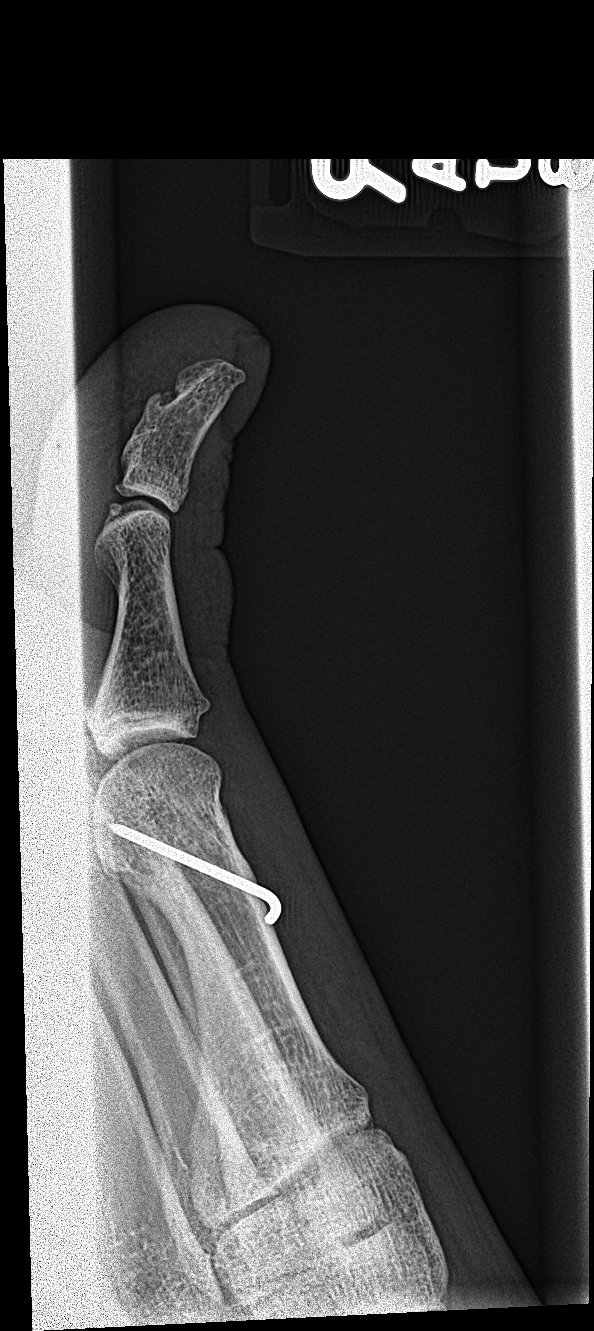

[4 of 4 positions shown; findings below may reference images not displayed]

FINDINGS: No acute fracture or dislocation. Postsurgical changes related to
prior first metatarsal head osteotomy. Joint spaces are preserved.
Bone mineralization is normal. Soft tissues are unremarkable.
IMPRESSION: Prior first metatarsal head osteotomy. No acute osseous abnormality
or significant degenerative changes.

## 2022-12-21 DIAGNOSIS — N898 Other specified noninflammatory disorders of vagina: Secondary | ICD-10-CM | POA: Diagnosis not present

## 2022-12-27 DIAGNOSIS — J019 Acute sinusitis, unspecified: Secondary | ICD-10-CM | POA: Diagnosis not present

## 2023-01-05 DIAGNOSIS — Z3A19 19 weeks gestation of pregnancy: Secondary | ICD-10-CM | POA: Diagnosis not present

## 2023-01-05 DIAGNOSIS — Z3689 Encounter for other specified antenatal screening: Secondary | ICD-10-CM | POA: Diagnosis not present

## 2023-01-05 DIAGNOSIS — O09522 Supervision of elderly multigravida, second trimester: Secondary | ICD-10-CM | POA: Diagnosis not present

## 2023-02-15 DIAGNOSIS — R0602 Shortness of breath: Secondary | ICD-10-CM | POA: Diagnosis not present

## 2023-02-15 DIAGNOSIS — Z3480 Encounter for supervision of other normal pregnancy, unspecified trimester: Secondary | ICD-10-CM | POA: Diagnosis not present

## 2023-02-15 DIAGNOSIS — O26893 Other specified pregnancy related conditions, third trimester: Secondary | ICD-10-CM | POA: Diagnosis not present

## 2023-02-15 DIAGNOSIS — Z3A25 25 weeks gestation of pregnancy: Secondary | ICD-10-CM | POA: Diagnosis not present

## 2023-02-15 DIAGNOSIS — O99512 Diseases of the respiratory system complicating pregnancy, second trimester: Secondary | ICD-10-CM | POA: Diagnosis not present

## 2023-02-15 DIAGNOSIS — R0600 Dyspnea, unspecified: Secondary | ICD-10-CM | POA: Diagnosis not present

## 2023-02-15 DIAGNOSIS — O09522 Supervision of elderly multigravida, second trimester: Secondary | ICD-10-CM | POA: Diagnosis not present

## 2023-02-15 DIAGNOSIS — O99891 Other specified diseases and conditions complicating pregnancy: Secondary | ICD-10-CM | POA: Diagnosis not present

## 2023-02-15 DIAGNOSIS — O99013 Anemia complicating pregnancy, third trimester: Secondary | ICD-10-CM | POA: Diagnosis not present

## 2023-02-15 DIAGNOSIS — Z20822 Contact with and (suspected) exposure to covid-19: Secondary | ICD-10-CM | POA: Diagnosis not present

## 2023-02-23 DIAGNOSIS — O99810 Abnormal glucose complicating pregnancy: Secondary | ICD-10-CM | POA: Diagnosis not present

## 2023-02-26 DIAGNOSIS — O26893 Other specified pregnancy related conditions, third trimester: Secondary | ICD-10-CM | POA: Diagnosis not present

## 2023-02-26 DIAGNOSIS — I340 Nonrheumatic mitral (valve) insufficiency: Secondary | ICD-10-CM | POA: Diagnosis not present

## 2023-02-26 DIAGNOSIS — R0602 Shortness of breath: Secondary | ICD-10-CM | POA: Diagnosis not present

## 2023-02-26 DIAGNOSIS — Z3A00 Weeks of gestation of pregnancy not specified: Secondary | ICD-10-CM | POA: Diagnosis not present

## 2023-03-05 DIAGNOSIS — O99019 Anemia complicating pregnancy, unspecified trimester: Secondary | ICD-10-CM | POA: Diagnosis not present

## 2023-03-05 DIAGNOSIS — Z3A00 Weeks of gestation of pregnancy not specified: Secondary | ICD-10-CM | POA: Diagnosis not present

## 2023-03-05 DIAGNOSIS — O09519 Supervision of elderly primigravida, unspecified trimester: Secondary | ICD-10-CM | POA: Diagnosis not present

## 2023-03-10 DIAGNOSIS — Z1332 Encounter for screening for maternal depression: Secondary | ICD-10-CM | POA: Diagnosis not present

## 2023-03-30 DIAGNOSIS — O4693 Antepartum hemorrhage, unspecified, third trimester: Secondary | ICD-10-CM | POA: Diagnosis not present

## 2023-03-30 DIAGNOSIS — O26853 Spotting complicating pregnancy, third trimester: Secondary | ICD-10-CM | POA: Diagnosis not present

## 2023-03-30 DIAGNOSIS — Z3A31 31 weeks gestation of pregnancy: Secondary | ICD-10-CM | POA: Diagnosis not present

## 2023-03-31 DIAGNOSIS — R0602 Shortness of breath: Secondary | ICD-10-CM | POA: Diagnosis not present

## 2023-03-31 DIAGNOSIS — Z3480 Encounter for supervision of other normal pregnancy, unspecified trimester: Secondary | ICD-10-CM | POA: Diagnosis not present

## 2023-04-12 DIAGNOSIS — O99019 Anemia complicating pregnancy, unspecified trimester: Secondary | ICD-10-CM | POA: Diagnosis not present

## 2023-04-12 DIAGNOSIS — O09529 Supervision of elderly multigravida, unspecified trimester: Secondary | ICD-10-CM | POA: Diagnosis not present

## 2023-04-12 DIAGNOSIS — Z3A00 Weeks of gestation of pregnancy not specified: Secondary | ICD-10-CM | POA: Diagnosis not present

## 2023-04-12 DIAGNOSIS — O99891 Other specified diseases and conditions complicating pregnancy: Secondary | ICD-10-CM | POA: Diagnosis not present

## 2023-04-12 DIAGNOSIS — R0602 Shortness of breath: Secondary | ICD-10-CM | POA: Diagnosis not present

## 2023-04-15 DIAGNOSIS — Z23 Encounter for immunization: Secondary | ICD-10-CM | POA: Diagnosis not present

## 2023-04-15 DIAGNOSIS — Z3493 Encounter for supervision of normal pregnancy, unspecified, third trimester: Secondary | ICD-10-CM | POA: Diagnosis not present

## 2023-04-15 DIAGNOSIS — O99019 Anemia complicating pregnancy, unspecified trimester: Secondary | ICD-10-CM

## 2023-04-15 HISTORY — DX: Anemia complicating pregnancy, unspecified trimester: O99.019

## 2023-04-19 DIAGNOSIS — Z3A00 Weeks of gestation of pregnancy not specified: Secondary | ICD-10-CM | POA: Diagnosis not present

## 2023-04-19 DIAGNOSIS — O99019 Anemia complicating pregnancy, unspecified trimester: Secondary | ICD-10-CM | POA: Diagnosis not present

## 2023-04-27 DIAGNOSIS — Z3A35 35 weeks gestation of pregnancy: Secondary | ICD-10-CM | POA: Diagnosis not present

## 2023-04-27 DIAGNOSIS — O479 False labor, unspecified: Secondary | ICD-10-CM | POA: Diagnosis not present

## 2023-05-05 DIAGNOSIS — Z3490 Encounter for supervision of normal pregnancy, unspecified, unspecified trimester: Secondary | ICD-10-CM | POA: Diagnosis not present

## 2023-05-05 DIAGNOSIS — Z3A36 36 weeks gestation of pregnancy: Secondary | ICD-10-CM | POA: Diagnosis not present

## 2023-05-05 DIAGNOSIS — O99213 Obesity complicating pregnancy, third trimester: Secondary | ICD-10-CM | POA: Diagnosis not present

## 2023-05-05 DIAGNOSIS — O09523 Supervision of elderly multigravida, third trimester: Secondary | ICD-10-CM | POA: Diagnosis not present

## 2023-05-05 DIAGNOSIS — Z362 Encounter for other antenatal screening follow-up: Secondary | ICD-10-CM | POA: Diagnosis not present

## 2023-05-06 DIAGNOSIS — E669 Obesity, unspecified: Secondary | ICD-10-CM | POA: Diagnosis not present

## 2023-05-06 DIAGNOSIS — Z3480 Encounter for supervision of other normal pregnancy, unspecified trimester: Secondary | ICD-10-CM | POA: Diagnosis not present

## 2023-05-06 DIAGNOSIS — Z3A36 36 weeks gestation of pregnancy: Secondary | ICD-10-CM | POA: Diagnosis not present

## 2023-05-06 DIAGNOSIS — O99213 Obesity complicating pregnancy, third trimester: Secondary | ICD-10-CM | POA: Diagnosis not present

## 2023-05-06 DIAGNOSIS — O09523 Supervision of elderly multigravida, third trimester: Secondary | ICD-10-CM | POA: Diagnosis not present

## 2023-05-20 DIAGNOSIS — Z3480 Encounter for supervision of other normal pregnancy, unspecified trimester: Secondary | ICD-10-CM | POA: Diagnosis not present

## 2023-05-20 DIAGNOSIS — Z23 Encounter for immunization: Secondary | ICD-10-CM | POA: Diagnosis not present

## 2023-07-16 DIAGNOSIS — K439 Ventral hernia without obstruction or gangrene: Secondary | ICD-10-CM

## 2023-07-16 HISTORY — DX: Ventral hernia without obstruction or gangrene: K43.9

## 2023-07-20 ENCOUNTER — Ambulatory Visit (INDEPENDENT_AMBULATORY_CARE_PROVIDER_SITE_OTHER): Payer: Medicaid Other | Admitting: General Surgery

## 2023-07-20 ENCOUNTER — Encounter: Payer: Self-pay | Admitting: General Surgery

## 2023-07-20 VITALS — BP 136/92 | HR 58 | Temp 99.0°F | Ht 65.0 in | Wt 182.6 lb

## 2023-07-20 DIAGNOSIS — K429 Umbilical hernia without obstruction or gangrene: Secondary | ICD-10-CM | POA: Diagnosis not present

## 2023-07-20 NOTE — Patient Instructions (Addendum)
Your CT is scheduled for 07/23/2023 9:30 am (arrive by 9:15 am) at [facility] on [address].   If you have any concerns or questions, please feel free to call our office. See follow up appointment.   Umbilical Hernia, Adult  A hernia is a lump of tissue that pushes through an opening in the muscles. An umbilical hernia happens in the belly, near the belly button. The hernia may contain tissues from the small or large intestine. It may also have fatty tissue that covers the intestines. Umbilical hernias in adults may get worse over time. They need to be treated with surgery. There are several types of umbilical hernias. They include: Indirect hernia. This occurs just above or below the belly button. It's the most common type of umbilical hernia in adults. Direct hernia. This type occurs in an opening that's formed by the belly button. Reducible hernia. This hernia comes and goes. You may see it only when you strain, cough, or lift something heavy. This type of hernia can be pushed back into the belly (reduced). Incarcerated hernia. This traps the hernia in the wall of the belly. This type of hernia can't be pushed back into the belly. It can cause a strangulated hernia. Strangulated hernia. This hernia cuts off blood flow to the tissues inside the hernia. The tissues can die if this happens. This type of hernia must be treated right away. What are the causes? An umbilical hernia happens when tissue inside the belly pushes through an opening in the muscles of the belly. What increases the risk? You're more likely to get this hernia if: You strain while lifting or pushing heavy objects. You've had several pregnancies. You have a condition that puts pressure on your belly, and you've had it for a long time. These include: Obesity. A buildup of fluid inside your belly. Vomiting or coughing all the time. Trouble pooping (constipation). You've had surgery that weakened the muscles in  the belly. What are the signs or symptoms? The main symptom of this condition is a bulge at the belly button or near it. The bulge does not cause pain. Other symptoms depend on the type of hernia you have. A reducible hernia may be seen only when you strain, cough, or lift something heavy. Other symptoms may include: Dull pain. A feeling of pressure. An incarcerated hernia may cause very bad pain. Also, you may: Vomit or feel like you may vomit. Not be able to pass gas. A strangulated hernia may cause: Pain that gets worse and worse. Vomiting, or feeling like you may vomit. Pain when you press on the hernia. Change of color on the skin over the hernia. The skin may become red or purple. Trouble pooping. Blood in the poop. How is this diagnosed? This condition may be diagnosed based on: Your symptoms and medical history. A physical exam. You may be asked to cough or strain while standing. These actions will put pressure inside your belly. The pressure can force the hernia through the opening in your muscles. Your health care provider may try to push the hernia back into your belly (reduce). How is this treated? Surgery is the only treatment for an umbilical hernia. Surgery for a strangulated hernia must be done right away. If you have a small hernia that's not incarcerated, you may need to lose weight before the surgery is done. Follow these instructions at home: Managing constipation You may need to take these actions to prevent trouble pooping. This will help to  prevent straining. Drink enough fluid to keep your pee (urine) pale yellow. Take over-the-counter or prescription medicines. Eat foods that are high in fiber, such as beans, whole grains, and fresh fruits and vegetables. Limit foods that are high in fat and sugars, such as fried or sweet foods. General instructions Do not try to push the hernia back in. Lose weight, if told by your provider. Watch your hernia for any  changes in color or size. Tell your provider if any changes occur. You may need to avoid activities that put pressure on your hernia. You may have to avoid lifting. Ask your provider how much you can safely lift. Take over-the-counter and prescription medicines only as told by your provider. Contact a health care provider if: Your hernia gets larger or feels hard. Your hernia becomes painful. You get a fever or chills. Get help right away if: You get very bad pain near the area of the hernia, and the pain comes on suddenly. You have pain and you vomit or feel like you may vomit. The skin over your hernia changes color. These symptoms may be an emergency. Get help right away. Call 911. Do not wait to see if the symptoms go away. Do not drive yourself to the hospital. This information is not intended to replace advice given to you by your health care provider. Make sure you discuss any questions you have with your health care provider. Document Revised: 12/22/2022 Document Reviewed: 12/22/2022 Elsevier Patient Education  2024 [employer].

## 2023-07-20 NOTE — Progress Notes (Signed)
Patient ID: Janice Oconnor, female   DOB: 03/17/1986, 37 y.o.   MRN: [id number] CC: Ventral Hernia History of Present Illness Janice Oconnor is a 37 y.o. female who presents in evaluation for ventral hernia.  The patient reports that she noticed a bulge around her bellybutton last November.  She subsequently had a another pregnancy and delivered in September.  She says since then she is continue to have a bulge.  She does have pain right around her umbilicus and says she has not tried to reduce the bulge.  She also notes an upper abdominal bulge when she contracts her stomach muscles.  She denies any over lying skin changes nor has she had any obstructive symptoms.  She does not plan on having any further pregnancies.  Past Medical History History reviewed. No pertinent past medical history.     Past Surgical History:  Procedure Laterality Date   DILATION AND CURETTAGE OF UTERUS  2009   DILATION AND CURETTAGE OF UTERUS N/A 11/21/2018   Procedure: DILATATION AND CURETTAGE FOR RETAINED PRODUCTS OF CONCEPTION.;  Surgeon: Leola Brazil, MD;  Location: [facility] OR[facility];  Service: Gynecology;  Laterality: N/A;   FOOT SURGERY  2011   LESION REMOVAL N/A 01/17/2015   Procedure: MINOR EXICISION OF LESION;  Surgeon: Vernie Murders, MD;  Location: [facility];  Service: ENT;  Laterality: N/A;    No Known Allergies  Current Outpatient Medications  Medication Sig Dispense Refill   norethindrone-ethinyl estradiol-iron (ESTROSTEP FE,TILIA FE,TRI-LEGEST FE) 1-20/1-30/1-35 MG-MCG tablet Take 1 tablet by mouth daily.     No current facility-administered medications for this visit.    Family History History reviewed. No pertinent family history.     Social History Social History   Tobacco Use   Smoking status: Never   Smokeless tobacco: Never  Vaping Use   Vaping status: Never Used  Substance Use Topics   Alcohol use: Yes    Alcohol/week: 0.0 standard drinks of alcohol    Comment:  Rare- 1x/4 months   Drug use: No       ROS Full ROS of systems performed and is otherwise negative there than what is stated in the HPI  Physical Exam Blood pressure (!) 136/92, pulse (!) 58, temperature 99 F (37.2 C), temperature source Oral, height 5\' 5"  (1.651 m), weight 182 lb 9.6 oz (82.8 kg), SpO2 99%, unknown if currently breastfeeding.  PERRLA, no acute distress, alert and oriented x 3, moving all extremities spontaneously.  On abdominal exam she has loose excess skin and at her bellybutton there is a small reducible umbilical hernia that measures approximately 0.5 cm of defect.  When I asked her to contract her stomach muscles she has a midline bulge that appears to be a diastases recti however there may be a small upper abdominal defect.  Data Reviewed Reviewed her referral records.  She had an umbilical hernia noticed prior to her last pregnancy and is referred here for that.  I have personally reviewed the patient's imaging and medical records.    Assessment    The patient is a 37 year old who has a very small umbilical hernia as well as a upper abdominal midline abdominal bulge.  Plan    Gust with her that her umbilical hernia could be repaired given that she has had pain in the area.  She does have an upper abdominal bulge that on visual inspection appears to be a diastases recti but IR is a concern for an upper abdominal defect in  the fascia.  Due to this I think it is appropriate for [place] to get a CT scan to assess if there is any other evidence of ventral hernia other than the umbilical hernia.  He just has the umbilical hernia then we can repair this open without mesh but if she has a larger ventral hernia then we may need to use minimally invasive surgical options as well as mesh.  We will see her back when she completes her CT scan    Kandis Cocking 07/20/2023, 10:57 AM

## 2023-07-23 ENCOUNTER — Ambulatory Visit
Admission: RE | Admit: 2023-07-23 | Discharge: 2023-07-23 | Disposition: A | Discharge status: Home / Self Care | Payer: Medicaid Other | Source: Ambulatory Visit | Attending: General Surgery | Admitting: General Surgery

## 2023-07-23 DIAGNOSIS — K429 Umbilical hernia without obstruction or gangrene: Secondary | ICD-10-CM | POA: Diagnosis present

## 2023-07-23 MED ORDER — IOHEXOL 300 MG/ML  SOLN
100.0000 mL | Freq: Once | INTRAMUSCULAR | Status: AC | PRN
  Administered 2023-07-23: 100 mL via INTRAVENOUS

## 2023-07-29 ENCOUNTER — Ambulatory Visit (INDEPENDENT_AMBULATORY_CARE_PROVIDER_SITE_OTHER): Payer: 59 | Admitting: General Surgery

## 2023-07-29 ENCOUNTER — Encounter: Payer: Self-pay | Admitting: General Surgery

## 2023-07-29 ENCOUNTER — Telehealth: Payer: Self-pay | Admitting: General Surgery

## 2023-07-29 VITALS — BP 134/87 | HR 71 | Temp 99.2°F | Ht 65.0 in | Wt 188.6 lb

## 2023-07-29 DIAGNOSIS — K439 Ventral hernia without obstruction or gangrene: Secondary | ICD-10-CM

## 2023-07-29 DIAGNOSIS — K429 Umbilical hernia without obstruction or gangrene: Secondary | ICD-10-CM | POA: Diagnosis not present

## 2023-07-29 NOTE — Patient Instructions (Signed)
You have requested for your Umbilical Hernia be repaired. This will be scheduled with Dr. Maurine Minister at [facility].   Please see your (blue)pre-care sheet for information. Our surgery scheduler will call you to verify surgery date and to go over information.   You will need to arrange to be off work for 1-2 weeks but will have to have a lifting restriction of no more than 15 lbs for 6 weeks following your surgery. If you have FMLA or disability paperwork that needs filled out you may drop this off at our office or this can be faxed to [phone].     Umbilical Hernia, Adult A hernia is a bulge of tissue that pushes through an opening between muscles. An umbilical hernia happens in the abdomen, near the belly button (umbilicus). The hernia may contain tissues from the small intestine, large intestine, or fatty tissue covering the intestines (omentum). Umbilical hernias in adults tend to get worse over time, and they require surgical treatment. There are several types of umbilical hernias. You may have: A hernia located just above or below the umbilicus (indirect hernia). This is the most common type of umbilical hernia in adults. A hernia that forms through an opening formed by the umbilicus (direct hernia). A hernia that comes and goes (reducible hernia). A reducible hernia may be visible only when you strain, lift something heavy, or cough. This type of hernia can be pushed back into the abdomen (reduced). A hernia that traps abdominal tissue inside the hernia (incarcerated hernia). This type of hernia cannot be reduced. A hernia that cuts off blood flow to the tissues inside the hernia (strangulated hernia). The tissues can start to die if this happens. This type of hernia requires emergency treatment.  What are the causes? An umbilical hernia happens when tissue inside the abdomen presses on a weak area of the abdominal muscles. What increases the risk? You may have a  greater risk of this condition if you: Are obese. Have had several pregnancies. Have a buildup of fluid inside your abdomen (ascites). Have had surgery that weakens the abdominal muscles.  What are the signs or symptoms? The main symptom of this condition is a painless bulge at or near the belly button. A reducible hernia may be visible only when you strain, lift something heavy, or cough. Other symptoms may include: Dull pain. A feeling of pressure.  Symptoms of a strangulated hernia may include: Pain that gets increasingly worse. Nausea and vomiting. Pain when pressing on the hernia. Skin over the hernia becoming red or purple. Constipation. Blood in the stool.  How is this diagnosed? This condition may be diagnosed based on: A physical exam. You may be asked to cough or strain while standing. These actions increase the pressure inside your abdomen and force the hernia through the opening in your muscles. Your health care provider may try to reduce the hernia by pressing on it. Your symptoms and medical history.  How is this treated? Surgery is the only treatment for an umbilical hernia. Surgery for a strangulated hernia is done as soon as possible. If you have a small hernia that is not incarcerated, you may need to lose weight before having surgery. Follow these instructions at home: Lose weight, if told by your health care provider. Do not try to push the hernia back in. Watch your hernia for any changes in color or size. Tell your health care provider if any changes occur. You may need to avoid activities  that increase pressure on your hernia. Do not lift anything that is heavier than 10 lb (4.5 kg) until your health care provider says that this is safe. Take over-the-counter and prescription medicines only as told by your health care provider. Keep all follow-up visits as told by your health care provider. This is important. Contact a health care provider if: Your hernia  gets larger. Your hernia becomes painful. Get help right away if: You develop sudden, severe pain near the area of your hernia. You have pain as well as nausea or vomiting. You have pain and the skin over your hernia changes color. You develop a fever. This information is not intended to replace advice given to you by your health care provider. Make sure you discuss any questions you have with your health care provider. Document Released: 01/31/2016 Document Revised: 05/03/2016 Document Reviewed: 01/31/2016 Elsevier Interactive Patient Education  [employer].

## 2023-07-29 NOTE — Telephone Encounter (Signed)
Patient has been advised of Pre-Admission date/time, and Surgery date at [facility].  Surgery Date: 08/20/23 Preadmission Testing Date: 08/10/23 (phone 8a-1p)  Patient has been made aware to call [phone], between 1-3:00pm the day before surgery, to find out what time to arrive for surgery.

## 2023-08-02 ENCOUNTER — Ambulatory Visit: Payer: Self-pay | Admitting: General Surgery

## 2023-08-02 DIAGNOSIS — K439 Ventral hernia without obstruction or gangrene: Secondary | ICD-10-CM

## 2023-08-02 NOTE — Progress Notes (Signed)
Outpatient Surgical Follow Up  08/02/2023  Janice Oconnor is an 37 y.o. female.   Chief Complaint  Patient presents with   Follow-up    Ventral hernia    HPI: The patient returns today to discuss her CT scans.  She says that she still notices a bulge around her bellybutton.  She has not had any pain from it.  She had a CT scan done that showed 2 small supraumbilical hernias as well as an umbilical hernia.  She also has evidence of diastases.  History reviewed. No pertinent past medical history.  Past Surgical History:  Procedure Laterality Date   DILATION AND CURETTAGE OF UTERUS  2009   DILATION AND CURETTAGE OF UTERUS N/A 11/21/2018   Procedure: DILATATION AND CURETTAGE FOR RETAINED PRODUCTS OF CONCEPTION.;  Surgeon: Leola Brazil, MD;  Location: [facility];  Service: Gynecology;  Laterality: N/A;   FOOT SURGERY  2011   LESION REMOVAL N/A 01/17/2015   Procedure: MINOR EXICISION OF LESION;  Surgeon: Vernie Murders, MD;  Location: [facility];  Service: ENT;  Laterality: N/A;    History reviewed. No pertinent family history.  Social History:  reports that she has never smoked. She has never used smokeless tobacco. She reports current alcohol use. She reports that she does not use drugs.  Allergies: No Known Allergies  Medications reviewed.    ROS Full ROS performed and is otherwise negative other than what is stated in HPI   BP 134/87 (BP Location: Right Arm, Patient Position: Sitting, Cuff Size: Small)   Pulse 71   Temp 99.2 F (37.3 C) (Oral)   Ht 5\' 5"  (1.651 m)   Wt 188 lb 9.6 oz (85.5 kg)   SpO2 99%   BMI 31.38 kg/m   Physical Exam Abdomen is soft, nontender.  There is evidence of diastases recti when she crunches her abdominal muscles.  Just superior to the umbilicus there is a spot of herniation of fat.  She also has a small umbilical hernia.   CT abdomen pelvis IMPRESSION: 1. 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 2. Small midline supraumbilical hernia containing fat, just superior to the abdominal wall diastasis. 3. Cholelithiasis. 4. Two small indeterminate liver lesions. One may be a flash filling hemangioma. Recommend further evaluation with liver protocol MRI without and with contrast.   Assessment/Plan: Janice Oconnor is a 37 year old female who has 2 small periumbilical hernias and an umbilical hernia.  These both appear to contain fat.  On exam the fat is not completely reducible.  I discussed with her that the hernias are small and only contain fat so it is her decision of whether she wants this repaired and when.  I discussed with her that the surgery would not correct her diastases recti but would correct the small umbilical hernias that she has.  I also discussed that given that she has more than 1 hernia I would elect to do this robotically and place a mesh.  I had a talk with her about the risk, benefits alternatives of the procedure include risk of infection, bleeding, damage to underlying viscera and recurrence of the hernia.  She understands these risks and wants to proceed with surgical correction of her hernias.  Baker Pierini, M.D. [facility]

## 2023-08-10 ENCOUNTER — Other Ambulatory Visit: Payer: Self-pay

## 2023-08-10 ENCOUNTER — Encounter
Admission: RE | Admit: 2023-08-10 | Discharge: 2023-08-10 | Disposition: A | Discharge status: Home / Self Care | Payer: Medicaid Other | Source: Ambulatory Visit | Attending: General Surgery | Admitting: General Surgery

## 2023-08-10 VITALS — Ht 65.0 in | Wt 189.0 lb

## 2023-08-10 DIAGNOSIS — Z01812 Encounter for preprocedural laboratory examination: Secondary | ICD-10-CM

## 2023-08-10 NOTE — Patient Instructions (Addendum)
Your procedure is scheduled on: Friday, December 6 Report to the Registration Desk on the 1st floor of the [facility] To find out your arrival time, please call [phone] between 1PM - 3PM on: Thursday, December 5 If your arrival time is 6:00 am, do not arrive before that time as the [facility] entrance doors do not open until 6:00 am.  REMEMBER: Instructions that are not followed completely may result in serious medical risk, up to and including death; or upon the discretion of your surgeon and anesthesiologist your surgery may need to be rescheduled.  Do not eat or drink after midnight the night before surgery.  No gum chewing or hard candies.  One week prior to surgery: starting November 29 Stop Anti-inflammatories (NSAIDS) such as Advil, Aleve, Ibuprofen, Motrin, Naproxen, Naprosyn and Aspirin based products such as Excedrin, Goody's Powder, BC Powder. Stop ANY OVER THE COUNTER supplements until after surgery.  You may however, continue to take Tylenol if needed for pain up until the day of surgery.  ON THE DAY OF SURGERY DO NOT TAKE ANY MEDICATIONS  No Alcohol for 24 hours before or after surgery.  No Smoking including e-cigarettes for 24 hours before surgery.  No chewable tobacco products for at least 6 hours before surgery.  No nicotine patches on the day of surgery.  Do not use any "recreational" drugs for at least a week (preferably 2 weeks) before your surgery.  Please be advised that the combination of cocaine and anesthesia may have negative outcomes, up to and including death. If you test positive for cocaine, your surgery will be cancelled.  On the morning of surgery brush your teeth with toothpaste and water, you may rinse your mouth with mouthwash if you wish. Do not swallow any toothpaste or mouthwash.  Use CHG Soap as directed on instruction sheet.  Do not wear jewelry, make-up, hairpins, clips or nail polish.  For welded (permanent) jewelry: bracelets,  anklets, waist bands, etc.  Please have this removed prior to surgery.  If it is not removed, there is a chance that hospital personnel will need to cut it off on the day of surgery.  Do not wear lotions, powders, or perfumes.   Do not shave body hair from the neck down 48 hours before surgery.  Contact lenses, hearing aids and dentures may not be worn into surgery.  Do not bring valuables to the hospital. [facility] is not responsible for any missing/lost belongings or valuables.   Notify your doctor if there is any change in your medical condition (cold, fever, infection).  Wear comfortable clothing (specific to your surgery type) to the hospital.  After surgery, you can help prevent lung complications by doing breathing exercises.  Take deep breaths and cough every 1-2 hours.   If you are being discharged the day of surgery, you will not be allowed to drive home. You will need a responsible individual to drive you home and stay with you for 24 hours after surgery.   If you are taking public transportation, you will need to have a responsible individual with you.  Please call the Pre-admissions Testing Dept. at [phone] if you have any questions about these instructions.  Surgery Visitation Policy:  Patients having surgery or a procedure may have two visitors.  Children under the age of 57 must have an adult with them who is not the patient.     Preparing for Surgery with CHLORHEXIDINE GLUCONATE (CHG) Soap  Chlorhexidine Gluconate (CHG) Soap  o An antiseptic cleaner that kills germs and bonds with the skin to continue killing germs even after washing  o Used for showering the night before surgery and morning of surgery  Before surgery, you can play an important role by reducing the number of germs on your skin.  CHG (Chlorhexidine gluconate) soap is an antiseptic cleanser which kills germs and bonds with the skin to continue killing germs even after  washing.  Please do not use if you have an allergy to CHG or antibacterial soaps. If your skin becomes reddened/irritated stop using the CHG.  1. Shower the NIGHT BEFORE SURGERY and the MORNING OF SURGERY with CHG soap.  2. If you choose to wash your hair, wash your hair first as usual with your normal shampoo.  3. After shampooing, rinse your hair and body thoroughly to remove the shampoo.  4. Use CHG as you would any other liquid soap. You can apply CHG directly to the skin and wash gently with a scrungie or a clean washcloth.  5. Apply the CHG soap to your body only from the neck down. Do not use on open wounds or open sores. Avoid contact with your eyes, ears, mouth, and genitals (private parts). Wash face and genitals (private parts) with your normal soap.  6. Wash thoroughly, paying special attention to the area where your surgery will be performed.  7. Thoroughly rinse your body with warm water.  8. Do not shower/wash with your normal soap after using and rinsing off the CHG soap.  9. Pat yourself dry with a clean towel.  10. Wear clean pajamas to bed the night before surgery.  12. Place clean sheets on your bed the night of your first shower and do not sleep with pets.  13. Shower again with the CHG soap on the day of surgery prior to arriving at the hospital.  14. Do not apply any deodorants/lotions/powders.  15. Please wear clean clothes to the hospital.

## 2023-08-19 MED ORDER — CHLORHEXIDINE GLUCONATE CLOTH 2 % EX PADS: 6.0000 | MEDICATED_PAD | Freq: Once | CUTANEOUS | Status: DC

## 2023-08-19 MED ORDER — CHLORHEXIDINE GLUCONATE 0.12 % MT SOLN: 15.0000 mL | Freq: Once | OROMUCOSAL | Status: DC

## 2023-08-19 MED ORDER — ORAL CARE MOUTH RINSE: 15.0000 mL | Freq: Once | OROMUCOSAL | Status: DC

## 2023-08-19 MED ORDER — LACTATED RINGERS IV SOLN: INTRAVENOUS | Status: DC

## 2023-08-19 MED ORDER — CEFAZOLIN SODIUM-DEXTROSE 2-4 GM/100ML-% IV SOLN
2.0000 g | INTRAVENOUS | Status: AC
  Administered 2023-08-20: 2 g via INTRAVENOUS

## 2023-08-20 ENCOUNTER — Other Ambulatory Visit: Payer: Self-pay

## 2023-08-20 ENCOUNTER — Encounter: Payer: Self-pay | Admitting: General Surgery

## 2023-08-20 ENCOUNTER — Ambulatory Visit
Admission: RE | Admit: 2023-08-20 | Discharge: 2023-08-20 | Disposition: A | Discharge status: Home / Self Care | Payer: 59 | Attending: General Surgery | Admitting: General Surgery

## 2023-08-20 ENCOUNTER — Ambulatory Visit: Payer: 59 | Admitting: General Practice

## 2023-08-20 ENCOUNTER — Encounter
Admission: RE | Disposition: A | Discharge status: Home / Self Care | Payer: Self-pay | Source: Home / Self Care | Attending: General Surgery

## 2023-08-20 DIAGNOSIS — K436 Other and unspecified ventral hernia with obstruction, without gangrene: Secondary | ICD-10-CM | POA: Diagnosis not present

## 2023-08-20 DIAGNOSIS — K439 Ventral hernia without obstruction or gangrene: Secondary | ICD-10-CM | POA: Insufficient documentation

## 2023-08-20 DIAGNOSIS — K429 Umbilical hernia without obstruction or gangrene: Secondary | ICD-10-CM | POA: Insufficient documentation

## 2023-08-20 DIAGNOSIS — M6208 Separation of muscle (nontraumatic), other site: Secondary | ICD-10-CM | POA: Insufficient documentation

## 2023-08-20 DIAGNOSIS — Z01812 Encounter for preprocedural laboratory examination: Secondary | ICD-10-CM

## 2023-08-20 HISTORY — PX: XI ROBOTIC ASSISTED VENTRAL HERNIA: SHX6789

## 2023-08-20 LAB — POCT PREGNANCY, URINE: Preg Test, Ur: NEGATIVE

## 2023-08-20 SURGERY — REPAIR, HERNIA, VENTRAL, ROBOT-ASSISTED
Anesthesia: General

## 2023-08-20 MED ORDER — BUPIVACAINE-EPINEPHRINE (PF) 0.5% -1:200000 IJ SOLN
INTRAMUSCULAR | Status: DC | PRN
Start: 2023-08-20 — End: 2023-08-20
  Administered 2023-08-20: 10 mL

## 2023-08-20 MED ORDER — ROCURONIUM BROMIDE 10 MG/ML (PF) SYRINGE
PREFILLED_SYRINGE | INTRAVENOUS | Status: AC
  Filled 2023-08-20: qty 10

## 2023-08-20 MED ORDER — ROCURONIUM BROMIDE 100 MG/10ML IV SOLN
INTRAVENOUS | Status: DC | PRN
  Administered 2023-08-20: 20 mg via INTRAVENOUS
  Administered 2023-08-20: 60 mg via INTRAVENOUS
  Administered 2023-08-20: 20 mg via INTRAVENOUS

## 2023-08-20 MED ORDER — CEFAZOLIN SODIUM-DEXTROSE 2-4 GM/100ML-% IV SOLN
INTRAVENOUS | Status: AC
  Filled 2023-08-20: qty 100

## 2023-08-20 MED ORDER — PHENYLEPHRINE 80 MCG/ML (10ML) SYRINGE FOR IV PUSH (FOR BLOOD PRESSURE SUPPORT)
PREFILLED_SYRINGE | INTRAVENOUS | Status: DC | PRN
  Administered 2023-08-20: 160 ug via INTRAVENOUS
  Administered 2023-08-20 (×3): 80 ug via INTRAVENOUS
  Administered 2023-08-20: 160 ug via INTRAVENOUS
  Administered 2023-08-20: 80 ug via INTRAVENOUS

## 2023-08-20 MED ORDER — PROPOFOL 10 MG/ML IV BOLUS
INTRAVENOUS | Status: AC
  Filled 2023-08-20: qty 20

## 2023-08-20 MED ORDER — FENTANYL CITRATE (PF) 100 MCG/2ML IJ SOLN
INTRAMUSCULAR | Status: DC | PRN
  Administered 2023-08-20 (×2): 50 ug via INTRAVENOUS

## 2023-08-20 MED ORDER — MIDAZOLAM HCL 2 MG/2ML IJ SOLN
INTRAMUSCULAR | Status: DC | PRN
  Administered 2023-08-20: 2 mg via INTRAVENOUS

## 2023-08-20 MED ORDER — OXYCODONE HCL 5 MG PO TABS: 5.0000 mg | ORAL_TABLET | Freq: Once | ORAL | Status: DC | PRN

## 2023-08-20 MED ORDER — ONDANSETRON HCL 4 MG/2ML IJ SOLN
INTRAMUSCULAR | Status: DC | PRN
  Administered 2023-08-20: 4 mg via INTRAVENOUS

## 2023-08-20 MED ORDER — MIDAZOLAM HCL 2 MG/2ML IJ SOLN
INTRAMUSCULAR | Status: AC
  Filled 2023-08-20: qty 2

## 2023-08-20 MED ORDER — SUGAMMADEX SODIUM 200 MG/2ML IV SOLN
INTRAVENOUS | Status: DC | PRN
  Administered 2023-08-20: 200 mg via INTRAVENOUS

## 2023-08-20 MED ORDER — PHENYLEPHRINE HCL-NACL 20-0.9 MG/250ML-% IV SOLN
INTRAVENOUS | Status: DC | PRN
  Administered 2023-08-20: 20 ug/min via INTRAVENOUS

## 2023-08-20 MED ORDER — ACETAMINOPHEN 10 MG/ML IV SOLN
INTRAVENOUS | Status: AC
  Filled 2023-08-20: qty 100

## 2023-08-20 MED ORDER — CHLORHEXIDINE GLUCONATE 0.12 % MT SOLN
OROMUCOSAL | Status: AC
  Filled 2023-08-20: qty 15

## 2023-08-20 MED ORDER — OXYCODONE HCL 5 MG/5ML PO SOLN: 5.0000 mg | Freq: Once | ORAL | Status: DC | PRN

## 2023-08-20 MED ORDER — BUPIVACAINE-EPINEPHRINE (PF) 0.5% -1:200000 IJ SOLN
INTRAMUSCULAR | Status: AC
  Filled 2023-08-20: qty 10

## 2023-08-20 MED ORDER — PROPOFOL 10 MG/ML IV BOLUS
INTRAVENOUS | Status: AC
  Filled 2023-08-20: qty 40

## 2023-08-20 MED ORDER — PHENYLEPHRINE HCL-NACL 20-0.9 MG/250ML-% IV SOLN
INTRAVENOUS | Status: AC
  Filled 2023-08-20: qty 250

## 2023-08-20 MED ORDER — KETAMINE HCL 50 MG/5ML IJ SOSY
PREFILLED_SYRINGE | INTRAMUSCULAR | Status: DC | PRN
  Administered 2023-08-20 (×2): 25 mg via INTRAVENOUS

## 2023-08-20 MED ORDER — BUPIVACAINE LIPOSOME 1.3 % IJ SUSP
INTRAMUSCULAR | Status: DC | PRN
Start: 2023-08-20 — End: 2023-08-20
  Administered 2023-08-20: 10 mL

## 2023-08-20 MED ORDER — ONDANSETRON HCL 4 MG/2ML IJ SOLN
INTRAMUSCULAR | Status: AC
  Filled 2023-08-20: qty 2

## 2023-08-20 MED ORDER — FENTANYL CITRATE (PF) 100 MCG/2ML IJ SOLN
INTRAMUSCULAR | Status: AC
  Filled 2023-08-20: qty 2

## 2023-08-20 MED ORDER — BUPIVACAINE LIPOSOME 1.3 % IJ SUSP
INTRAMUSCULAR | Status: AC
  Filled 2023-08-20: qty 20

## 2023-08-20 MED ORDER — PROPOFOL 10 MG/ML IV BOLUS
INTRAVENOUS | Status: DC | PRN
  Administered 2023-08-20: 50 mg via INTRAVENOUS
  Administered 2023-08-20: 150 mg via INTRAVENOUS
  Administered 2023-08-20: 20 ug/kg/min via INTRAVENOUS

## 2023-08-20 MED ORDER — DEXAMETHASONE SODIUM PHOSPHATE 10 MG/ML IJ SOLN
INTRAMUSCULAR | Status: DC | PRN
  Administered 2023-08-20: 10 mg via INTRAVENOUS

## 2023-08-20 MED ORDER — ACETAMINOPHEN 10 MG/ML IV SOLN
INTRAVENOUS | Status: DC | PRN
  Administered 2023-08-20: 1000 mg via INTRAVENOUS

## 2023-08-20 MED ORDER — DEXAMETHASONE SODIUM PHOSPHATE 10 MG/ML IJ SOLN
INTRAMUSCULAR | Status: AC
  Filled 2023-08-20: qty 1

## 2023-08-20 MED ORDER — KETAMINE HCL 50 MG/5ML IJ SOSY
PREFILLED_SYRINGE | INTRAMUSCULAR | Status: AC
  Filled 2023-08-20: qty 5

## 2023-08-20 MED ORDER — LIDOCAINE HCL (CARDIAC) PF 100 MG/5ML IV SOSY
PREFILLED_SYRINGE | INTRAVENOUS | Status: DC | PRN
  Administered 2023-08-20: 80 mg via INTRAVENOUS

## 2023-08-20 MED ORDER — OXYCODONE HCL 5 MG PO TABS: 5.0000 mg | ORAL_TABLET | Freq: Four times a day (QID) | ORAL | 0 refills | Status: DC | PRN

## 2023-08-20 MED ORDER — PHENYLEPHRINE 80 MCG/ML (10ML) SYRINGE FOR IV PUSH (FOR BLOOD PRESSURE SUPPORT)
PREFILLED_SYRINGE | INTRAVENOUS | Status: AC
  Filled 2023-08-20: qty 10

## 2023-08-20 MED ORDER — FENTANYL CITRATE (PF) 100 MCG/2ML IJ SOLN: 25.0000 ug | INTRAMUSCULAR | Status: DC | PRN

## 2023-08-20 SURGICAL SUPPLY — 49 items
BAG PRESSURE INF REUSE 1000 (BAG) IMPLANT
BINDER ABDOMINAL 12 ML 46-62 (SOFTGOODS) IMPLANT
BLADE SURG SZ11 CARB STEEL (BLADE) ×1 IMPLANT
COVER TIP SHEARS 8 DVNC (MISCELLANEOUS) ×1 IMPLANT
COVER WAND RF STERILE (DRAPES) ×1 IMPLANT
DERMABOND ADVANCED .7 DNX12 (GAUZE/BANDAGES/DRESSINGS) ×1 IMPLANT
DRAPE ARM DVNC X/XI (DISPOSABLE) ×3 IMPLANT
DRAPE COLUMN DVNC XI (DISPOSABLE) ×1 IMPLANT
ELECT REM PT RETURN 9FT ADLT (ELECTROSURGICAL) ×1
ELECTRODE REM PT RTRN 9FT ADLT (ELECTROSURGICAL) ×1 IMPLANT
FORCEPS BPLR R/ABLATION 8 DVNC (INSTRUMENTS) ×1 IMPLANT
GLOVE BIO SURGEON STRL SZ 6.5 (GLOVE) ×2 IMPLANT
GLOVE BIOGEL PI IND STRL 6.5 (GLOVE) ×2 IMPLANT
GOWN STRL REUS W/ TWL LRG LVL3 (GOWN DISPOSABLE) ×3 IMPLANT
IRRIGATOR SUCT 8 DISP DVNC XI (IRRIGATION / IRRIGATOR) IMPLANT
IV CATH ANGIO 12GX3 LT BLUE (NEEDLE) IMPLANT
IV NS 1000ML BAXH (IV SOLUTION) IMPLANT
KIT PINK PAD W/HEAD ARE REST (MISCELLANEOUS) ×1
KIT PINK PAD W/HEAD ARM REST (MISCELLANEOUS) ×1 IMPLANT
LABEL OR SOLS (LABEL) ×1 IMPLANT
MANIFOLD NEPTUNE II (INSTRUMENTS) ×1 IMPLANT
MESH PROGRIP HERNIA FLAT 15X15 (Mesh General) IMPLANT
MESH PROGRIP LAP SLF FIX 16X12 (Mesh General) ×1 IMPLANT
MESH VENTRALIGHT ST 4.5IN (Mesh General) IMPLANT
MESH VENTRALIGHT ST 4X6IN (Mesh General) IMPLANT
NDL DRIVE SUT CUT DVNC (INSTRUMENTS) ×1 IMPLANT
NDL HYPO 22X1.5 SAFETY MO (MISCELLANEOUS) ×1 IMPLANT
NDL INSUFFLATION 14GA 120MM (NEEDLE) ×1 IMPLANT
NEEDLE DRIVE SUT CUT DVNC (INSTRUMENTS) ×1
NEEDLE HYPO 22X1.5 SAFETY MO (MISCELLANEOUS) ×1
NEEDLE INSUFFLATION 14GA 120MM (NEEDLE) ×1
NS IRRIG 500ML POUR BTL (IV SOLUTION) ×1 IMPLANT
OBTURATOR OPTICAL STND 8 DVNC (TROCAR) ×1
OBTURATOR OPTICALSTD 8 DVNC (TROCAR) ×1 IMPLANT
PACK LAP CHOLECYSTECTOMY (MISCELLANEOUS) ×1 IMPLANT
SCISSORS MNPLR CVD DVNC XI (INSTRUMENTS) ×1 IMPLANT
SEAL UNIV 5-12 XI (MISCELLANEOUS) ×3 IMPLANT
SET TUBE SMOKE EVAC HIGH FLOW (TUBING) ×1 IMPLANT
SOL ELECTROSURG ANTI STICK (MISCELLANEOUS) ×1
SOLUTION ELECTROSURG ANTI STCK (MISCELLANEOUS) ×1 IMPLANT
SUT MNCRL 4-0 27XMFL (SUTURE) ×1
SUT STRATA 2-0 30 CT-2 (SUTURE) ×2 IMPLANT
SUT STRATAFIX PDS 30 CT-1 (SUTURE) ×1 IMPLANT
SUT VIC AB 2-0 SH 27XBRD (SUTURE) IMPLANT
SUT VICRYL 0 UR6 27IN ABS (SUTURE) ×1 IMPLANT
SUTURE MNCRL 4-0 27XMF (SUTURE) ×1 IMPLANT
TAPE TRANSPORE STRL 2 31045 (GAUZE/BANDAGES/DRESSINGS) ×1 IMPLANT
TRAP FLUID SMOKE EVACUATOR (MISCELLANEOUS) ×1 IMPLANT
WATER STERILE IRR 500ML POUR (IV SOLUTION) ×1 IMPLANT

## 2023-08-20 NOTE — Op Note (Signed)
Operative note Diagnosis: Ventral hernia, diastases recti Postop diagnosis: Same Procedure: Robotic assisted ventral hernia repair-Trans-abdominal, pre-peritoneal mesh placement and plication of diastases recti Surgeon: Baker Pierini, MD EBL: 10 cc Implant: ProGrip polypropylene mesh.  After informed consent was obtained the patient was brought to the operating room placed supine on the operating table.  General endotracheal anesthesia was then induced and her abdomen was then prepped and draped in the usual sterile fashion.  Surgical timeout was called identifying correct patient, site, side and procedure.  An incision was made at Palmer's point.  A Veress needle was inserted into the abdomen in the left upper quadrant.  This was done using standard drop technique.  Pneumoperitoneum was then established.  We then placed a an 8 mm robotic trocar at this site under direct visualization using an Optiview trocar.  There was noted to be no evidence of injury at the site of the Veress needle insertion.  Upon survey of the abdomen there was a very small ventral hernia just superior to the umbilicus.  We then placed 2 more 8 mm robotic trocars along the left anterior axillary line under direct visualization.  The robotic platform was then docked.  The peritoneum was then incised and a plane along the preperitoneal space was created.  This was done across the abdomen and we then encountered our defect.  There were 2 small defects encountered both superior to the umbilicus.  The overlying tissue was quite thin and there was a hole made in the skin.  This was closed with 4-0 Monocryl.  The total size of the hernia defects was approximately 1-1/2 cm.  The superior hernias defect had incarcerated preperitoneal fat in it.  This was reduced.  The hernia defects were then closed using a 0 STRATAFIX suture.  Next I was able to plicate the midline to reapproximate her rectus abdominis muscles.  She did have a fair amount of  diastases recti and I was able to bring this together at the site of her hernia defect.  This was done using a another 0 STRATAFIX suture.  We then brought a ProGrip mesh into the field.  This ProGrip mesh allowed for approximately 4 cm of overlap in all directions.  The ProGrip was inserted into the preperitoneal space.  The center of the mesh was centered on the hernia defect and it was allowed to unfold and self adhere to the anterior abdominal wall.  He laid flat against the anterior abdominal wall.  The peritoneal flap was then closed with a 2-0 STRATAFIX suture.  There were small holes within the peritoneal flap and these were closed with a 2-0 Vicryl suture.  We had good coverage of our mesh so as not to expose it to the peritoneal structures.  The lateral preperitoneal space was then infiltrated with 20 cc of Marcaine and Exparel solution.  The abdomen was then desufflated.  The incisions were then closed with 4-0 Monocryl.  Nominal binder was then placed and the patient was awoken from general endotracheal anesthesia.  Prior to closure all sponge and instrument counts were correct x 2.

## 2023-08-20 NOTE — Anesthesia Preprocedure Evaluation (Signed)
Anesthesia Evaluation  Patient identified by MRN, date of birth, ID band Patient awake    Reviewed: Allergy & Precautions, NPO status , Patient's Chart, lab work & pertinent test results  Airway Mallampati: III  TM Distance: >3 FB Neck ROM: full    Dental  (+) Chipped, Dental Advidsory Given   Pulmonary neg pulmonary ROS   Pulmonary exam normal        Cardiovascular negative cardio ROS Normal cardiovascular exam     Neuro/Psych negative neurological ROS  negative psych ROS   GI/Hepatic negative GI ROS, Neg liver ROS,,,  Endo/Other  negative endocrine ROS    Renal/GU      Musculoskeletal   Abdominal   Peds  Hematology negative hematology ROS (+)   Anesthesia Other Findings Past Medical History: 04/2023: Antepartum anemia     Comment:  Venofer (Iron Sucrose) infusions 07/2023: Ventral hernia  Past Surgical History: 09/15/2007: DILATION AND CURETTAGE OF UTERUS 11/21/2018: DILATION AND CURETTAGE OF UTERUS; N/A     Comment:  Procedure: DILATATION AND CURETTAGE FOR RETAINED               PRODUCTS OF CONCEPTION.;  Surgeon: Leola Brazil, MD;                Location: [facility];  Service: Gynecology;  Laterality:               N/A; 09/14/2009: FOOT SURGERY; Right     Comment:  bunion 01/17/2015: LESION REMOVAL; N/A     Comment:  Procedure: MINOR EXICISION OF LESION;  Surgeon: Vernie Murders, MD;  Location: [facility];  Service:               ENT;  Laterality: N/A;     Reproductive/Obstetrics negative OB ROS                             Anesthesia Physical Anesthesia Plan  ASA: 2  Anesthesia Plan: General ETT and General   Post-op Pain Management:    Induction: Intravenous  PONV Risk Score and Plan: 3 and Ondansetron, Midazolam and Dexamethasone  Airway Management Planned: Oral ETT  Additional Equipment:   Intra-op Plan:   Post-operative Plan:  Extubation in OR  Informed Consent: I have reviewed the patients History and Physical, chart, labs and discussed the procedure including the risks, benefits and alternatives for the proposed anesthesia with the patient or authorized representative who has indicated his/her understanding and acceptance.     Dental Advisory Given  Plan Discussed with: Anesthesiologist, CRNA and Surgeon  Anesthesia Plan Comments: (Patient consented for risks of anesthesia including but not limited to:  - adverse reactions to medications - damage to eyes, teeth, lips or other oral mucosa - nerve damage due to positioning  - sore throat or hoarseness - Damage to heart, brain, nerves, lungs, other parts of body or loss of life  Patient voiced understanding and assent.)       Anesthesia Quick Evaluation

## 2023-08-20 NOTE — Anesthesia Procedure Notes (Signed)
Procedure Name: Intubation Date/Time: 08/20/2023 7:39 AM  Performed by: Elisabeth Pigeon, CRNAPre-anesthesia Checklist: Patient identified, Patient being monitored, Timeout performed, Emergency Drugs available and Suction available Patient Re-evaluated:Patient Re-evaluated prior to induction Oxygen Delivery Method: Circle system utilized Preoxygenation: Pre-oxygenation with 100% oxygen Induction Type: IV induction Ventilation: Mask ventilation without difficulty Laryngoscope Size: Mac, 3 and McGrath Grade View: Grade I Tube type: Oral Tube size: 7.0 mm Number of attempts: 1 Airway Equipment and Method: Stylet Placement Confirmation: ETT inserted through vocal cords under direct vision, positive ETCO2 and breath sounds checked- equal and bilateral Secured at: 22 cm Tube secured with: Tape Dental Injury: Teeth and Oropharynx as per pre-operative assessment

## 2023-08-20 NOTE — H&P (Addendum)
Chief Complaint  Patient presents with   Follow-up      Ventral hernia    No change to below H and P, proceed with ventrla hernia repair with mesh.    HPI: The patient returns today to discuss her CT scans.  She says that she still notices a bulge around her bellybutton.  She has not had any pain from it.  She had a CT scan done that showed 2 small supraumbilical hernias as well as an umbilical hernia.  She also has evidence of diastases.   History reviewed. No pertinent past medical history.            Past Surgical History:  Procedure Laterality Date   DILATION AND CURETTAGE OF UTERUS   2009   DILATION AND CURETTAGE OF UTERUS N/A 11/21/2018    Procedure: DILATATION AND CURETTAGE FOR RETAINED PRODUCTS OF CONCEPTION.;  Surgeon: Leola Brazil, MD;  Location: [facility];  Service: Gynecology;  Laterality: N/A;   FOOT SURGERY   2011   LESION REMOVAL N/A 01/17/2015    Procedure: MINOR EXICISION OF LESION;  Surgeon: Vernie Murders, MD;  Location: [facility];  Service: ENT;  Laterality: N/A;          History reviewed. No pertinent family history.       Social History:  reports that she has never smoked. She has never used smokeless tobacco. She reports current alcohol use. She reports that she does not use drugs.   Allergies:  Allergies  No Known Allergies     Medications reviewed.       ROS Full ROS performed and is otherwise negative other than what is stated in HPI     BP 134/87 (BP Location: Right Arm, Patient Position: Sitting, Cuff Size: Small)   Pulse 71   Temp 99.2 F (37.3 C) (Oral)   Ht 5\' 5"  (1.651 m)   Wt 188 lb 9.6 oz (85.5 kg)   SpO2 99%   BMI 31.38 kg/m    Physical Exam Abdomen is soft, nontender.  There is evidence of diastases recti when she crunches her abdominal muscles.  Just superior to the umbilicus there is a spot of herniation of fat.  She also has a small umbilical hernia.  CTAB, RRR    CT abdomen pelvis IMPRESSION: 1. Diastasis  of the ventral abdominal wall with two superimposed small fat containing periumbilical hernias. 2. Small midline supraumbilical hernia containing fat, just superior to the abdominal wall diastasis. 3. Cholelithiasis. 4. Two small indeterminate liver lesions. One may be a flash filling hemangioma. Recommend further evaluation with liver protocol MRI without and with contrast.   Assessment/Plan: [name] is a 37 year old female who has 2 small periumbilical hernias and an umbilical hernia.  These both appear to contain fat.  On exam the fat is not completely reducible.  I discussed with her that the hernias are small and only contain fat so it is her decision of whether she wants this repaired and when.  I discussed with her that the surgery would not correct her diastases recti but would correct the small umbilical hernias that she has.  I also discussed that given that she has more than 1 hernia I would elect to do this robotically and place a mesh.  I had a talk with her about the risk, benefits alternatives of the procedure include risk of infection, bleeding, damage to underlying viscera and recurrence of the hernia.  She understands these risks and wants to proceed  with surgical correction of her hernias.   Baker Pierini, M.D. [facility]

## 2023-08-20 NOTE — Anesthesia Postprocedure Evaluation (Signed)
Anesthesia Post Note  Patient: Regulatory affairs officer  Procedure(s) Performed: XI ROBOTIC ASSISTED VENTRAL HERNIA with mesh  Patient location during evaluation: PACU Anesthesia Type: General Level of consciousness: awake and alert Pain management: pain level controlled Vital Signs Assessment: post-procedure vital signs reviewed and stable Respiratory status: spontaneous breathing, nonlabored ventilation, respiratory function stable and patient connected to nasal cannula oxygen Cardiovascular status: blood pressure returned to baseline and stable Postop Assessment: no apparent nausea or vomiting Anesthetic complications: no  No notable events documented.   Last Vitals:  Vitals:   08/20/23 1145 08/20/23 1213  BP: 129/84 (!) 141/92  Pulse: 84 83  Resp: 17 17  Temp:  (!) 36.4 C  SpO2: 99% 100%    Last Pain:  Vitals:   08/20/23 1213  TempSrc: Temporal  PainSc: 0-No pain                 Stephanie Coup

## 2023-08-20 NOTE — Transfer of Care (Signed)
Immediate Anesthesia Transfer of Care Note  Patient: Janice Oconnor  Procedure(s) Performed: XI ROBOTIC ASSISTED VENTRAL HERNIA with mesh  Patient Location: PACU  Anesthesia Type:General  Level of Consciousness: drowsy  Airway & Oxygen Therapy: Patient Spontanous Breathing and Patient connected to face mask oxygen  Post-op Assessment: Report given to RN and Post -op Vital signs reviewed and stable  Post vital signs: Reviewed and stable  Last Vitals:  Vitals Value Taken Time  BP 148/96 08/20/23 1100  Temp    Pulse 100 08/20/23 1103  Resp 29 08/20/23 1103  SpO2 100 % 08/20/23 1103  Vitals shown include unfiled device data.  Last Pain:  Vitals:   08/20/23 0636  TempSrc: Oral  PainSc: 0-No pain         Complications: No notable events documented.

## 2023-09-02 ENCOUNTER — Encounter: Payer: 59 | Admitting: General Surgery

## 2023-09-04 DIAGNOSIS — N939 Abnormal uterine and vaginal bleeding, unspecified: Secondary | ICD-10-CM | POA: Diagnosis not present

## 2023-09-16 ENCOUNTER — Encounter: Payer: Self-pay | Admitting: General Surgery

## 2023-09-16 ENCOUNTER — Ambulatory Visit: Payer: 59 | Admitting: General Surgery

## 2023-09-16 VITALS — BP 130/77 | HR 90 | Temp 98.0°F | Ht 65.0 in | Wt 190.0 lb

## 2023-09-16 DIAGNOSIS — Z09 Encounter for follow-up examination after completed treatment for conditions other than malignant neoplasm: Secondary | ICD-10-CM

## 2023-09-16 DIAGNOSIS — K439 Ventral hernia without obstruction or gangrene: Secondary | ICD-10-CM | POA: Diagnosis not present

## 2023-09-16 NOTE — Patient Instructions (Addendum)
  Follow up here in 4 weeks.  GENERAL POST-OPERATIVE PATIENT INSTRUCTIONS   WOUND CARE INSTRUCTIONS:  Try to keep the wound dry and avoid ointments on the wound unless directed to do so.  If the wound becomes bright red and painful or starts to drain infected material that is not clear, please contact your physician immediately.  If the wound is mildly pink and has a thick firm ridge underneath it, this is normal, and is referred to as a healing ridge.  This will resolve over the next 4-6 weeks.  BATHING: You may shower if you have been informed of this by your surgeon. However, Please do not submerge in a tub, hot tub, or pool until incisions are completely sealed or have been told by your surgeon that you may do so.  DIET:  You may eat any foods that you can tolerate.  It is a good idea to eat a high fiber diet and take in plenty of fluids to prevent constipation.  If you do become constipated you may want to take a mild laxative or take ducolax tablets on a daily basis until your bowel habits are regular.  Constipation can be very uncomfortable, along with straining, after recent surgery.  ACTIVITY:  You should not lift more than 20 pounds for 6 weeks total after surgery as it could put you at increased risk for complications.  Twenty pounds is roughly equivalent to a plastic bag of groceries. At that time- Listen to your body when lifting, if you have pain when lifting, stop and then try again in a few days. Soreness after doing exercises or activities of daily living is normal as you get back in to your normal routine.  MEDICATIONS:  Try to take narcotic medications and anti-inflammatory medications, such as tylenol , ibuprofen, naprosyn, etc., with food.  This will minimize stomach upset from the medication.  Should you develop nausea and vomiting from the pain medication, or develop a rash, please discontinue the medication and contact your physician.  You should not drive, make important  decisions, or operate machinery when taking narcotic pain medication.  SUNBLOCK Use sun block to incision area over the next year if this area will be exposed to sun. This helps decrease scarring and will allow you avoid a permanent darkened area over your incision.  QUESTIONS:  Please feel free to call our office if you have any questions, and we will be glad to assist you. [phone]

## 2023-09-16 NOTE — Progress Notes (Signed)
 Outpatient Surgical Follow Up CC: S/p Ventral Hernia Repair 09/16/2023  Janice Oconnor is an 38 y.o. female.   Chief Complaint  Patient presents with   Routine Post Op    HPI: The patient returns status post ventral hernia repair with mesh.  She reports doing well.  Her pain is controlled.  She is tolerating a diet and having normal bowel function.  She does have an opening at the umbilicus where there is some drainage of serous fluid.  She is covering this with gauze.  She denies any fevers or chills.  She denies any bulges.  Past Medical History:  Diagnosis Date   Antepartum anemia 04/2023   Venofer  (Iron Sucrose ) infusions   Ventral hernia 07/2023    Past Surgical History:  Procedure Laterality Date   DILATION AND CURETTAGE OF UTERUS  09/15/2007   DILATION AND CURETTAGE OF UTERUS N/A 11/21/2018   Procedure: DILATATION AND CURETTAGE FOR RETAINED PRODUCTS OF CONCEPTION.;  Surgeon: Neomi Mitzie BROCKS, MD;  Location: [facility];  Service: Gynecology;  Laterality: N/A;   FOOT SURGERY Right 09/14/2009   bunion   LESION REMOVAL N/A 01/17/2015   Procedure: MINOR EXICISION OF LESION;  Surgeon: Deward Argue, MD;  Location: [facility];  Service: ENT;  Laterality: N/A;   XI ROBOTIC ASSISTED VENTRAL HERNIA N/A 08/20/2023   Procedure: XI ROBOTIC ASSISTED VENTRAL HERNIA with mesh;  Surgeon: Marinda Jayson KIDD, MD;  Location: [facility];  Service: General;  Laterality: N/A;    No family history on file.  Social History:  reports that she has never smoked. She has never been exposed to tobacco smoke. She has never used smokeless tobacco. She reports current alcohol use. She reports that she does not use drugs.  Allergies: No Known Allergies  Medications reviewed.    ROS Full ROS performed and is otherwise negative other than what is stated in HPI   BP 130/77   Pulse 90   Temp 98 F (36.7 C)   Ht 5' 5 (1.651 m)   Wt 190 lb (86.2 kg)   SpO2 98%   BMI 31.62 kg/m    Physical Exam Abdomen soft, appropriately tender over the umbilicus.  No rebound tenderness or guarding.  No evidence of hernia recurrence.  At the umbilicus there is a separation of skin.  There is some underlying tissue exposed but it does not look like it is infected and is not draining any purulence.  However, I was able to see some of the V-Loc stitch that close the peritoneal defect.  I do not see underlying mesh    No results found for this or any previous visit (from the past 48 hours). No results found.  Assessment/Plan: Patient is status post ventral hernia repair.  Overall she is doing well but has a opening of the wound that is draining serous fluid.  There was some exposed V-Loc suture that is used to close the peritoneal defect.  I do not see any underlying mesh exposed.  My hope is that the mesh is incorporating and that the opening of the skin will close on its own.  I did discuss with her that this may take a long time.  If she continues to drain then my concern to be that the mesh is infected but we will see if she is able to heal.  We will place bacitracin  over the wound and cover with gauze.  I instructed for her to do this daily.  We will see her  again in 4 weeks.  Jayson Endow, M.D. [facility]

## 2023-10-14 ENCOUNTER — Encounter: Payer: 59 | Admitting: General Surgery

## 2023-10-28 ENCOUNTER — Encounter: Payer: 59 | Admitting: General Surgery

## 2023-11-04 ENCOUNTER — Encounter: Payer: Self-pay | Admitting: General Surgery

## 2023-11-04 ENCOUNTER — Ambulatory Visit: Payer: 59 | Admitting: General Surgery

## 2023-11-04 VITALS — BP 121/82 | HR 71 | Temp 98.4°F | Ht 65.0 in | Wt 193.6 lb

## 2023-11-04 DIAGNOSIS — K439 Ventral hernia without obstruction or gangrene: Secondary | ICD-10-CM | POA: Diagnosis not present

## 2023-11-04 DIAGNOSIS — Z09 Encounter for follow-up examination after completed treatment for conditions other than malignant neoplasm: Secondary | ICD-10-CM | POA: Diagnosis not present

## 2023-11-04 NOTE — Progress Notes (Signed)
Outpatient Surgical Follow Up  11/04/2023  Janice Oconnor is an 38 y.o. female.   Chief Complaint  Patient presents with   Routine Post Op    Ventral hernia repair 08/20/2023    HPI: Patient returns status post ventral hernia repair.  She reports doing well and has had no further drainage from her umbilicus.  She denies feeling any bulges.  She denies any further pain.  Past Medical History:  Diagnosis Date   Antepartum anemia 04/2023   Venofer (Iron Sucrose) infusions   Ventral hernia 07/2023    Past Surgical History:  Procedure Laterality Date   DILATION AND CURETTAGE OF UTERUS  09/15/2007   DILATION AND CURETTAGE OF UTERUS N/A 11/21/2018   Procedure: DILATATION AND CURETTAGE FOR RETAINED PRODUCTS OF CONCEPTION.;  Surgeon: Leola Brazil, MD;  Location: [facility];  Service: Gynecology;  Laterality: N/A;   FOOT SURGERY Right 09/14/2009   bunion   LESION REMOVAL N/A 01/17/2015   Procedure: MINOR EXICISION OF LESION;  Surgeon: Vernie Murders, MD;  Location: [facility];  Service: ENT;  Laterality: N/A;   XI ROBOTIC ASSISTED VENTRAL HERNIA N/A 08/20/2023   Procedure: XI ROBOTIC ASSISTED VENTRAL HERNIA with mesh;  Surgeon: Kandis Cocking, MD;  Location: [facility];  Service: General;  Laterality: N/A;    History reviewed. No pertinent family history.  Social History:  reports that she has never smoked. She has never been exposed to tobacco smoke. She has never used smokeless tobacco. She reports current alcohol use. She reports that she does not use drugs.  Allergies: No Known Allergies  Medications reviewed.    ROS Full ROS performed and is otherwise negative other than what is stated in HPI   BP 121/82   Pulse 71   Temp 98.4 F (36.9 C) (Oral)   Ht 5\' 5"  (1.651 m)   Wt 193 lb 9.6 oz (87.8 kg)   SpO2 97%   BMI 32.22 kg/m   Physical Exam Abdomen soft, nontender and nondistended.  At her umbilicus there is no opening of the wound.  There is some  spitting of stitch that was trimmed back to skin level.  There is no opening or drainage at this site.  There is no evidence of recurrence of her hernia just superior and left lateral to her umbilicus there is a small cyst that is not bothering her.    No results found for this or any previous visit (from the past 48 hours). No results found.  Assessment/Plan:  Patient doing well status post ventral hernia repair.  No drainage and no pain.  She can follow-up with [place] on an as-needed basis   Baker Pierini, M.D. [facility]

## 2023-11-04 NOTE — Patient Instructions (Signed)
Laparoscopic Surgery for Belly Hernias: What to Know After After the procedure, it's common to have pain, discomfort, or soreness. Follow these instructions at home: Medicines Take your medicines only as told. You may need to take steps to help treat or prevent trouble pooping (constipation), such as: Taking medicines to help you poop. Eating foods high in fiber, like beans, whole grains, and fresh fruits and vegetables. Drinking more fluids as told. Ask your health care provider if it's safe to drive or use machines while taking your medicine. Incision care  Take care of the cuts in your belly as told. Make sure you: Wash your hands with soap and water for at least 20 seconds before and after you change your bandage. If you can't use soap and water, use hand sanitizer. Change your bandage. Leave stitches or skin glue alone. Leave tape strips alone unless you're told to take them off. You may trim the edges of the tape strips if they curl up. Check the cuts on your belly every day for signs of infection. Check for: More redness, swelling, or pain. More fluid or blood. Warmth. Pus or a bad smell. Activity Rest as told. Get up to take short walks at least every 2 hours during the day. This helps you breathe better and keeps your blood flowing. Ask for help if you feel weak or unsteady. Do not take baths, swim, or use a hot tub until you're told it's OK. Ask if you can shower. Ask if it's OK for you to lift. If you were given a sedative, do not drive or use machines until you're told it's safe. A sedative can make you sleepy. Ask what things are safe for you to do at home. Ask when you can go back to work or school. General instructions Hold a pillow over your belly when you cough or sneeze. This helps with pain. Wear a binder around your belly as told by your provider. Do not smoke, vape, or use nicotine or tobacco. Wear compression stockings to reduce swelling and help prevent blood  clots in your legs. You may be asked to continue to do deep breathing exercises at home. This will help to prevent a lung infection. Contact a health care provider if: You have any signs of infection. You have pain that gets worse or does not get better with medicine. You throw up or you feel like throwing up. You have a cough. You have not pooped in 3 days. You are not able to pee. You have a fever. Get help right away if: You have very bad pain in your belly. You throw up every time you eat or drink. You have redness, warmth, or pain in your leg. You have chest pain. You have trouble breathing. These symptoms may be an emergency. Call 911 right away. Do not wait to see if the symptoms will go away. Do not drive yourself to the hospital. This information is not intended to replace advice given to you by your health care provider. Make sure you discuss any questions you have with your health care provider. Document Revised: 03/09/2023 Document Reviewed: 03/09/2023 Elsevier Patient Education  2024 [employer].
